# Patient Record
Sex: Female | Born: 1985
Health system: Southern US, Community
[De-identification: ages and names within clinical notes are randomized; demographics above are authoritative.]

## PROBLEM LIST (undated history)

## (undated) DIAGNOSIS — F32A Depression, unspecified: Secondary | ICD-10-CM

## (undated) DIAGNOSIS — J302 Other seasonal allergic rhinitis: Secondary | ICD-10-CM

## (undated) DIAGNOSIS — J45909 Unspecified asthma, uncomplicated: Secondary | ICD-10-CM

## (undated) DIAGNOSIS — B019 Varicella without complication: Secondary | ICD-10-CM

## (undated) DIAGNOSIS — F329 Major depressive disorder, single episode, unspecified: Secondary | ICD-10-CM

## (undated) DIAGNOSIS — K219 Gastro-esophageal reflux disease without esophagitis: Secondary | ICD-10-CM

## (undated) DIAGNOSIS — J301 Allergic rhinitis due to pollen: Secondary | ICD-10-CM

## (undated) HISTORY — DX: Allergic rhinitis due to pollen: J30.1

## (undated) HISTORY — DX: Gastro-esophageal reflux disease without esophagitis: K21.9

## (undated) HISTORY — DX: Major depressive disorder, single episode, unspecified: F32.9

## (undated) HISTORY — DX: Depression, unspecified: F32.A

## (undated) HISTORY — DX: Varicella without complication: B01.9

## (undated) HISTORY — PX: EAR CYST EXCISION: SHX22

---

## 2004-03-18 ENCOUNTER — Ambulatory Visit (HOSPITAL_COMMUNITY): Admission: RE | Admit: 2004-03-18 | Discharge: 2004-03-18 | Payer: Self-pay | Admitting: Internal Medicine

## 2004-11-30 ENCOUNTER — Other Ambulatory Visit: Admission: RE | Admit: 2004-11-30 | Discharge: 2004-11-30 | Payer: Self-pay | Admitting: Gynecology

## 2005-11-28 ENCOUNTER — Other Ambulatory Visit: Admission: RE | Admit: 2005-11-28 | Discharge: 2005-11-28 | Payer: Self-pay | Admitting: Gynecology

## 2016-04-19 ENCOUNTER — Ambulatory Visit
Admission: EM | Admit: 2016-04-19 | Discharge: 2016-04-19 | Disposition: A | Discharge status: Home / Self Care | Payer: PRIVATE HEALTH INSURANCE | Attending: Family Medicine | Admitting: Family Medicine

## 2016-04-19 ENCOUNTER — Ambulatory Visit (INDEPENDENT_AMBULATORY_CARE_PROVIDER_SITE_OTHER): Payer: PRIVATE HEALTH INSURANCE

## 2016-04-19 DIAGNOSIS — R0600 Dyspnea, unspecified: Secondary | ICD-10-CM | POA: Diagnosis present

## 2016-04-19 DIAGNOSIS — S20219D Contusion of unspecified front wall of thorax, subsequent encounter: Secondary | ICD-10-CM | POA: Diagnosis not present

## 2016-04-19 DIAGNOSIS — J45909 Unspecified asthma, uncomplicated: Secondary | ICD-10-CM | POA: Diagnosis not present

## 2016-04-19 DIAGNOSIS — S29011A Strain of muscle and tendon of front wall of thorax, initial encounter: Secondary | ICD-10-CM

## 2016-04-19 DIAGNOSIS — R0789 Other chest pain: Secondary | ICD-10-CM

## 2016-04-19 DIAGNOSIS — S20219A Contusion of unspecified front wall of thorax, initial encounter: Secondary | ICD-10-CM | POA: Insufficient documentation

## 2016-04-19 HISTORY — DX: Other seasonal allergic rhinitis: J30.2

## 2016-04-19 HISTORY — DX: Unspecified asthma, uncomplicated: J45.909

## 2016-04-19 MED ORDER — CYCLOBENZAPRINE HCL 5 MG PO TABS: 5.0000 mg | ORAL_TABLET | Freq: Every day | ORAL | 0 refills | Status: AC

## 2016-04-19 MED ORDER — KETOROLAC TROMETHAMINE 10 MG PO TABS: 10.0000 mg | ORAL_TABLET | Freq: Four times a day (QID) | ORAL | 0 refills | Status: DC | PRN
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
## 2018-03-28 ENCOUNTER — Encounter: Payer: Self-pay | Admitting: Family Medicine

## 2018-03-28 ENCOUNTER — Ambulatory Visit (INDEPENDENT_AMBULATORY_CARE_PROVIDER_SITE_OTHER): Payer: Self-pay | Admitting: Family Medicine

## 2018-03-28 VITALS — BP 134/86 | HR 79 | Temp 97.9°F | Ht 61.0 in | Wt 235.2 lb

## 2018-03-28 DIAGNOSIS — J309 Allergic rhinitis, unspecified: Secondary | ICD-10-CM | POA: Insufficient documentation

## 2018-03-28 DIAGNOSIS — J4530 Mild persistent asthma, uncomplicated: Secondary | ICD-10-CM | POA: Insufficient documentation

## 2018-03-28 MED ORDER — BUDESONIDE-FORMOTEROL FUMARATE 160-4.5 MCG/ACT IN AERO: 2.0000 | INHALATION_SPRAY | Freq: Two times a day (BID) | RESPIRATORY_TRACT | 5 refills | Status: DC

## 2018-03-28 NOTE — Progress Notes (Signed)
Subjective:    Patient ID: Meredith Campbell, female    DOB: Nov 18, 1985, 32 y.o.   MRN: [id number]  HPI   Patient presents to clinic to establish primary care with new PCP.  She recently moved to the [facility] area from [place] for a new job.  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  Patient has no complaints today, but does need a refill on her Symbicort that she takes for control of her asthma and also needs a referral to local allergist due to getting weekly allergy injections for her chronic environmental and food allergies.  Patient Active Problem List   Diagnosis Date Noted  . Chronic allergic rhinitis 03/28/2018  . Mild persistent asthma 03/28/2018   Past Medical History:  Diagnosis Date  . Asthma   . Chicken pox   . Depression   . GERD (gastroesophageal reflux disease)   . Hay fever   . Seasonal allergies    Past Surgical History:  Procedure Laterality Date  . EAR CYST EXCISION     Family History  Problem Relation Age of Onset  . Diabetes Mother   . Hypertension Mother   . Hyperlipidemia Mother   . Kidney disease Paternal Uncle   . Diabetes Maternal Grandmother   . Hypertension Maternal Grandmother   . Stroke Paternal Grandmother    Social History   Tobacco Use  . Smoking status: Never Smoker  . Smokeless tobacco: Never Used  Substance Use Topics  . Alcohol use: No   Review of Systems   Constitutional: Negative for chills, fatigue and fever.  HENT: Negative for congestion, ear pain, sinus pain and sore throat.   Eyes: Negative.   Respiratory: Negative for cough, shortness of breath and wheezing.   Cardiovascular: Negative for chest pain, palpitations and leg swelling.  Gastrointestinal: Negative for abdominal pain, diarrhea, nausea and vomiting.  Genitourinary: Negative for dysuria, frequency and urgency.  Musculoskeletal: Negative for arthralgias and myalgias.  Skin: Negative for  color change, pallor and rash.  Neurological: Negative for syncope, light-headedness and headaches.  Psychiatric/Behavioral: The patient is not nervous/anxious.       Objective:   Physical Exam  Constitutional: She appears well-developed and well-nourished. No distress.  Head: Normocephalic and atraumatic.  Eyes: Pupils are equal, round, and reactive to light. EOM are normal. No scleral icterus.  Neck: Normal range of motion. Neck supple. No tracheal deviation present.  Cardiovascular: Normal rate, regular rhythm and normal heart sounds.  Pulmonary/Chest: Effort normal and breath sounds normal. No respiratory distress. She has no wheezes. She has no rales.   Neurological: She is alert and oriented to person, place, and time.  Gait normal  Skin: Skin is warm and dry. No pallor.  Psychiatric: She has a normal mood and affect. Her behavior is normal. Thought content normal.   Nursing note and vitals reviewed.     Vitals:   03/28/18 0827 03/28/18 0850  BP: (!) 138/98 134/86  Pulse: 79   Temp: 97.9 F (36.6 C)   SpO2: 99%     Assessment & Plan:    Asthma- patient Symbicort refilled.  Patient has albuterol inhaler she uses as needed, currently does not really refill.  Chronic allergic rhinitis-patient takes Zyrtec every day.  Patient given new referral to local allergist so she can continue with her allergy shots.  Patient advised to follow-up in 6 months for management of chronic conditions.  Patient is aware she can return  to clinic at any time if issues arise.

## 2018-05-03 DIAGNOSIS — T781XXA Other adverse food reactions, not elsewhere classified, initial encounter: Secondary | ICD-10-CM | POA: Diagnosis not present

## 2018-05-03 DIAGNOSIS — J4530 Mild persistent asthma, uncomplicated: Secondary | ICD-10-CM | POA: Diagnosis not present

## 2018-05-03 DIAGNOSIS — J309 Allergic rhinitis, unspecified: Secondary | ICD-10-CM | POA: Diagnosis not present

## 2018-05-03 DIAGNOSIS — H1045 Other chronic allergic conjunctivitis: Secondary | ICD-10-CM | POA: Diagnosis not present

## 2018-05-06 MED FILL — MONTELUKAST SOD 10 MG TAB: 10 | 90 days supply | Qty: 90 | Fill #0

## 2018-05-06 MED FILL — EPINEPHRINE 0.3 MG AUTO-INJ: 0.3 | 30 days supply | Qty: 2 | Fill #0

## 2018-05-08 DIAGNOSIS — J301 Allergic rhinitis due to pollen: Secondary | ICD-10-CM | POA: Diagnosis not present

## 2018-05-08 DIAGNOSIS — J3089 Other allergic rhinitis: Secondary | ICD-10-CM | POA: Diagnosis not present

## 2018-05-08 DIAGNOSIS — J3081 Allergic rhinitis due to animal (cat) (dog) hair and dander: Secondary | ICD-10-CM | POA: Diagnosis not present

## 2018-05-13 MED FILL — SYMBICORT 160-4.5 MCG INH: 160-4.5 | 30 days supply | Qty: 10 | Fill #0

## 2018-05-14 DIAGNOSIS — J3089 Other allergic rhinitis: Secondary | ICD-10-CM | POA: Diagnosis not present

## 2018-05-14 DIAGNOSIS — J301 Allergic rhinitis due to pollen: Secondary | ICD-10-CM | POA: Diagnosis not present

## 2018-05-14 DIAGNOSIS — J3081 Allergic rhinitis due to animal (cat) (dog) hair and dander: Secondary | ICD-10-CM | POA: Diagnosis not present

## 2018-05-21 DIAGNOSIS — J3081 Allergic rhinitis due to animal (cat) (dog) hair and dander: Secondary | ICD-10-CM | POA: Diagnosis not present

## 2018-05-21 DIAGNOSIS — J301 Allergic rhinitis due to pollen: Secondary | ICD-10-CM | POA: Diagnosis not present

## 2018-05-21 DIAGNOSIS — J3089 Other allergic rhinitis: Secondary | ICD-10-CM | POA: Diagnosis not present

## 2018-06-05 DIAGNOSIS — J3089 Other allergic rhinitis: Secondary | ICD-10-CM | POA: Diagnosis not present

## 2018-06-05 DIAGNOSIS — J3081 Allergic rhinitis due to animal (cat) (dog) hair and dander: Secondary | ICD-10-CM | POA: Diagnosis not present

## 2018-06-05 DIAGNOSIS — J301 Allergic rhinitis due to pollen: Secondary | ICD-10-CM | POA: Diagnosis not present

## 2018-07-02 ENCOUNTER — Other Ambulatory Visit: Payer: Self-pay

## 2018-07-02 ENCOUNTER — Ambulatory Visit
Admission: EM | Admit: 2018-07-02 | Discharge: 2018-07-02 | Disposition: A | Discharge status: Home / Self Care | Payer: 59 | Attending: Family Medicine | Admitting: Family Medicine

## 2018-07-02 ENCOUNTER — Encounter: Payer: Self-pay | Admitting: Emergency Medicine

## 2018-07-02 DIAGNOSIS — R05 Cough: Secondary | ICD-10-CM | POA: Diagnosis not present

## 2018-07-02 DIAGNOSIS — J111 Influenza due to unidentified influenza virus with other respiratory manifestations: Secondary | ICD-10-CM

## 2018-07-02 DIAGNOSIS — R69 Illness, unspecified: Secondary | ICD-10-CM | POA: Insufficient documentation

## 2018-07-02 DIAGNOSIS — J029 Acute pharyngitis, unspecified: Secondary | ICD-10-CM

## 2018-07-02 DIAGNOSIS — J3489 Other specified disorders of nose and nasal sinuses: Secondary | ICD-10-CM

## 2018-07-02 LAB — RAPID INFLUENZA A&B ANTIGENS (ARMC ONLY)
INFLUENZA A (ARMC): NEGATIVE
INFLUENZA B (ARMC): NEGATIVE

## 2018-07-02 LAB — RAPID STREP SCREEN (MED CTR MEBANE ONLY): STREPTOCOCCUS, GROUP A SCREEN (DIRECT): NEGATIVE

## 2018-07-02 MED ORDER — OSELTAMIVIR PHOSPHATE 75 MG PO CAPS: 75.0000 mg | ORAL_CAPSULE | Freq: Two times a day (BID) | ORAL | 0 refills | Status: DC

## 2018-07-02 NOTE — ED Provider Notes (Signed)
[facility]    CSN: [id number] Arrival date & time: 07/02/18  1802     History   Chief Complaint Chief Complaint  Patient presents with  . Generalized Body Aches    HPI Meredith Campbell is a 32 y.o. female.   The history is provided by the patient.  URI  Presenting symptoms: congestion, cough, fatigue, fever, rhinorrhea and sore throat   Severity:  Moderate Onset quality:  Sudden Duration:  1 day Timing:  Constant Progression:  Unchanged Chronicity:  New Relieved by:  None tried Ineffective treatments:  None tried Associated symptoms: myalgias   Associated symptoms: no sinus pain and no wheezing   Risk factors: chronic respiratory disease (asthma) and sick contacts   Risk factors: not elderly, no chronic cardiac disease, no chronic kidney disease, no diabetes mellitus, no immunosuppression, no recent illness and no recent travel     Past Medical History:  Diagnosis Date  . Asthma   . Chicken pox   . Depression   . GERD (gastroesophageal reflux disease)   . Hay fever   . Seasonal allergies     Patient Active Problem List   Diagnosis Date Noted  . Chronic allergic rhinitis 03/28/2018  . Mild persistent asthma 03/28/2018    Past Surgical History:  Procedure Laterality Date  . EAR CYST EXCISION      OB History   No obstetric history on file.      Home Medications    Prior to Admission medications   Medication Sig Start Date End Date Taking? Authorizing Provider  albuterol (PROVENTIL HFA;VENTOLIN HFA) 108 (90 Base) MCG/ACT inhaler Inhale into the lungs every 6 (six) hours as needed for wheezing or shortness of breath.   Yes [provider]  budesonide-formoterol (SYMBICORT) 160-4.5 MCG/ACT inhaler Inhale 2 puffs into the lungs 2 (two) times daily. 03/28/18  Yes Guse, Janna ArchLauren M, FNP  cetirizine (ZYRTEC) 10 MG tablet Take 10 mg by mouth daily.   Yes [provider]  fexofenadine (ALLEGRA) 60 MG tablet Take 60 mg by mouth 2  (two) times daily.   Yes [provider]  ergocalciferol (VITAMIN D2) 50000 units capsule Take 50,000 Units by mouth once a week.    [provider]  oseltamivir (TAMIFLU) 75 MG capsule Take 1 capsule (75 mg total) by mouth 2 (two) times daily. 07/02/18   Payton Mccallumonty, Earlena Werst, MD    Family History Family History  Problem Relation Age of Onset  . Diabetes Mother   . Hypertension Mother   . Hyperlipidemia Mother   . Kidney disease Paternal Uncle   . Diabetes Maternal Grandmother   . Hypertension Maternal Grandmother   . Stroke Paternal Grandmother     Social History Social History   Tobacco Use  . Smoking status: Never Smoker  . Smokeless tobacco: Never Used  Substance Use Topics  . Alcohol use: No  . Drug use: No     Allergies   Almond (diagnostic); Apple; Corn-containing products; Pear; Pearson sakrin [saccharin ammonium]; Plum pulp; Pork-derived products; Rice; and Wheat bran   Review of Systems Review of Systems  Constitutional: Positive for fatigue and fever.  HENT: Positive for congestion, rhinorrhea and sore throat. Negative for sinus pain.   Respiratory: Positive for cough. Negative for wheezing.   Musculoskeletal: Positive for myalgias.     Physical Exam Triage Vital Signs ED Triage Vitals  Enc Vitals Group     BP 07/02/18 1844 129/78     Pulse Rate 07/02/18 1844 (!) 128  Resp 07/02/18 1844 20     Temp 07/02/18 1844 (!) 100.7 F (38.2 C)     Temp Source 07/02/18 1844 Oral     SpO2 07/02/18 1844 100 %     Weight 07/02/18 1840 235 lb (106.6 kg)     Height 07/02/18 1840 5\' 1"  (1.549 m)     Head Circumference --      Peak Flow --      Pain Score 07/02/18 1839 8     Pain Loc --      Pain Edu? --      Excl. in GC? --    No data found.  Updated Vital Signs BP 129/78 (BP Location: Left Arm)   Pulse (!) 128   Temp (!) 100.7 F (38.2 C) (Oral)   Resp 20   Ht 5\' 1"  (1.549 m)   Wt 106.6 kg   LMP 06/01/2018   SpO2 100%   BMI 44.40  kg/m   Visual Acuity Right Eye Distance:   Left Eye Distance:   Bilateral Distance:    Right Eye Near:   Left Eye Near:    Bilateral Near:     Physical Exam Vitals signs and nursing note reviewed.  Constitutional:      General: She is not in acute distress.    Appearance: She is well-developed. She is not toxic-appearing or diaphoretic.  HENT:     Head: Normocephalic and atraumatic.     Right Ear: Tympanic membrane, ear canal and external ear normal.     Left Ear: Tympanic membrane, ear canal and external ear normal.     Nose: No nasal deformity, septal deviation or laceration.     Mouth/Throat:     Pharynx: Uvula midline. No pharyngeal swelling, oropharyngeal exudate, posterior oropharyngeal erythema or uvula swelling.  Eyes:     General: No scleral icterus.       Right eye: No discharge.        Left eye: No discharge.  Neck:     Musculoskeletal: Normal range of motion and neck supple.     Thyroid: No thyromegaly.  Cardiovascular:     Rate and Rhythm: Normal rate and regular rhythm.     Heart sounds: Normal heart sounds.  Pulmonary:     Effort: Pulmonary effort is normal. No respiratory distress.     Breath sounds: Normal breath sounds. No stridor. No wheezing, rhonchi or rales.  Lymphadenopathy:     Cervical: No cervical adenopathy.  Neurological:     Mental Status: She is alert.      UC Treatments / Results  Labs (all labs ordered are listed, but only abnormal results are displayed) Labs Reviewed  RAPID INFLUENZA A&B ANTIGENS ([facility] ONLY)  RAPID STREP SCREEN ([facility] ONLY)  CULTURE, GROUP A STREP [facility])    EKG None  Radiology No results found.  Procedures Procedures (including critical care time)  Medications Ordered in UC Medications - No data to display  Initial Impression / Assessment and Plan / UC Course  I have reviewed the triage vital signs and the nursing notes.  Pertinent labs & imaging results that were available during my care  of the patient were reviewed by me and considered in my medical decision making (see chart for details).      Final Clinical Impressions(s) / UC Diagnoses   Final diagnoses:  Influenza-like illness    ED Prescriptions    Medication Sig Dispense Auth. Provider   oseltamivir (TAMIFLU) 75 MG capsule Take  1 capsule (75 mg total) by mouth 2 (two) times daily. 10 capsule Payton Mccallum, MD     1. Lab results and diagnosis reviewed with patient 2. rx as per orders above; reviewed possible side effects, interactions, risks and benefits  3. Recommend supportive treatment with rest, fluids 4. Follow-up prn if symptoms worsen or don't improve  Controlled Substance Prescriptions [place] Controlled Substance Registry consulted? Not Applicable   Payton Mccallum, MD 07/02/18 2013

## 2018-07-02 NOTE — ED Triage Notes (Signed)
Pt c/o headache, nausea, bilateral ear pain, body aches, sore throat.and shortness of breath. Started yesterday.

## 2018-07-05 ENCOUNTER — Encounter: Payer: Self-pay | Admitting: Emergency Medicine

## 2018-07-05 ENCOUNTER — Ambulatory Visit
Admission: EM | Admit: 2018-07-05 | Discharge: 2018-07-05 | Disposition: A | Discharge status: Home / Self Care | Payer: 59 | Attending: Physician Assistant | Admitting: Physician Assistant

## 2018-07-05 ENCOUNTER — Other Ambulatory Visit: Payer: Self-pay

## 2018-07-05 DIAGNOSIS — J029 Acute pharyngitis, unspecified: Secondary | ICD-10-CM | POA: Insufficient documentation

## 2018-07-05 DIAGNOSIS — H6643 Suppurative otitis media, unspecified, bilateral: Secondary | ICD-10-CM | POA: Diagnosis not present

## 2018-07-05 LAB — CULTURE, GROUP A STREP (THRC)

## 2018-07-05 LAB — RAPID STREP SCREEN (MED CTR MEBANE ONLY): Streptococcus, Group A Screen (Direct): NEGATIVE

## 2018-07-05 MED ORDER — FLUTICASONE PROPIONATE 50 MCG/ACT NA SUSP: 1.0000 | Freq: Every day | NASAL | 2 refills | Status: DC

## 2018-07-05 MED ORDER — AMOXICILLIN-POT CLAVULANATE 875-125 MG PO TABS: 1.0000 | ORAL_TABLET | Freq: Two times a day (BID) | ORAL | 0 refills | Status: DC

## 2018-07-05 NOTE — ED Triage Notes (Signed)
Patient in today c/o sore throat, laryngitis and bilateral ear pain L>R x 3 days. Patient was seen 07/02/18 for same, but is getting worse. Patient denies fever, but patient has been taking Tylenol and Ibuprofen routinely.

## 2018-07-05 NOTE — ED Provider Notes (Signed)
[facility]    CSN: [id number] Arrival date & time: 07/05/18  4098     History   Chief Complaint Chief Complaint  Patient presents with  . Sore Throat  . Laryngitis    HPI Meredith Campbell is a 33 y.o. female.   Patient is a 33 year old female who presents with complaint of sore throat and nasal congestion since this past Tuesday.  Today is Friday and states her symptoms are getting worse.  Patient was seen at this facility on December 31 for flulike symptoms and given a prescription for Tamiflu.  Patient states her muscle aches nausea have improved and denies any current chills.  Patient does report nasal congestion and headache as well as some bilateral ear pain.  Patient she has been taking ibuprofen and Tylenol around-the-clock as well as Cepacol lozenges for cough.  Patient does states she is taking Symbicort twice a day as normal for her asthma but has had to use her albuterol inhaler 1-2 times a day which is not normal for her.  Patient did have a fever when she was here on Tuesday of 100.7 but has not had any since then but again is taking the NSAIDs.     Past Medical History:  Diagnosis Date  . Asthma   . Chicken pox   . Depression   . GERD (gastroesophageal reflux disease)   . Hay fever   . Seasonal allergies     Patient Active Problem List   Diagnosis Date Noted  . Chronic allergic rhinitis 03/28/2018  . Mild persistent asthma 03/28/2018    Past Surgical History:  Procedure Laterality Date  . EAR CYST EXCISION      OB History   No obstetric history on file.      Home Medications    Prior to Admission medications   Medication Sig Start Date End Date Taking? Authorizing Provider  albuterol (PROVENTIL HFA;VENTOLIN HFA) 108 (90 Base) MCG/ACT inhaler Inhale into the lungs every 6 (six) hours as needed for wheezing or shortness of breath.   Yes [provider]  budesonide-formoterol (SYMBICORT) 160-4.5 MCG/ACT inhaler Inhale 2 puffs  into the lungs 2 (two) times daily. 03/28/18  Yes Guse, Janna Arch, FNP  cetirizine (ZYRTEC) 10 MG tablet Take 10 mg by mouth daily.   Yes [provider]  ergocalciferol (VITAMIN D2) 50000 units capsule Take 50,000 Units by mouth once a week.   Yes [provider]  fexofenadine (ALLEGRA) 60 MG tablet Take 60 mg by mouth 2 (two) times daily.   Yes [provider]  oseltamivir (TAMIFLU) 75 MG capsule Take 1 capsule (75 mg total) by mouth 2 (two) times daily. 07/02/18  Yes Payton Mccallum, MD  amoxicillin-clavulanate (AUGMENTIN) 875-125 MG tablet Take 1 tablet by mouth every 12 (twelve) hours. 07/05/18   Candis Schatz, PA-C  fluticasone (FLONASE) 50 MCG/ACT nasal spray Place 1 spray into both nostrils daily. 07/05/18   Candis Schatz, PA-C    Family History Family History  Problem Relation Age of Onset  . Diabetes Mother   . Hypertension Mother   . Hyperlipidemia Mother   . Kidney disease Paternal Uncle   . Diabetes Maternal Grandmother   . Hypertension Maternal Grandmother   . Stroke Paternal Grandmother     Social History Social History   Tobacco Use  . Smoking status: Never Smoker  . Smokeless tobacco: Never Used  Substance Use Topics  . Alcohol use: No  . Drug use: No  Allergies   Almond (diagnostic); Apple; Corn-containing products; Pear; Pearson sakrin [saccharin ammonium]; Plum pulp; Pork-derived products; Rice; and Wheat bran   Review of Systems Review of Systems as noted above in HPI.  Other systems reviewed and found to be negative.   Physical Exam Triage Vital Signs ED Triage Vitals  Enc Vitals Group     BP 07/05/18 0846 126/88     Pulse Rate 07/05/18 0846 99     Resp 07/05/18 0846 16     Temp 07/05/18 0846 98 F (36.7 C)     Temp Source 07/05/18 0846 Oral     SpO2 07/05/18 0846 99 %     Weight 07/05/18 0846 235 lb (106.6 kg)     Height 07/05/18 0846 5\' 1"  (1.549 m)     Head Circumference --      Peak Flow --      Pain  Score 07/05/18 0845 9     Pain Loc --      Pain Edu? --      Excl. in GC? --    No data found.  Updated Vital Signs BP 126/88 (BP Location: Left Arm)   Pulse 99   Temp 98 F (36.7 C) (Oral)   Resp 16   Ht 5\' 1"  (1.549 m)   Wt 235 lb (106.6 kg)   LMP 07/02/2018 (Exact Date)   SpO2 99%   BMI 44.40 kg/m    Physical Exam Vitals signs reviewed.  Constitutional:      Appearance: She is well-developed. She is not ill-appearing.  HENT:     Right Ear: A middle ear effusion is present.     Left Ear: A middle ear effusion is present.     Mouth/Throat:     Mouth: Mucous membranes are moist.     Pharynx: Posterior oropharyngeal erythema present.     Tonsils: Tonsillar exudate present. Swelling: 1+ on the right. 1+ on the left.  Neck:     Musculoskeletal: Normal range of motion.  Cardiovascular:     Rate and Rhythm: Normal rate and regular rhythm.     Heart sounds: Normal heart sounds. No murmur. No gallop.   Pulmonary:     Effort: Pulmonary effort is normal.     Breath sounds: Normal breath sounds. No wheezing, rhonchi or rales.  Abdominal:     Palpations: Abdomen is soft.  Lymphadenopathy:     Cervical: No cervical adenopathy.  Skin:    General: Skin is warm and dry.     Capillary Refill: Capillary refill takes less than 2 seconds.  Neurological:     General: No focal deficit present.     Mental Status: She is alert and oriented to person, place, and time.  Psychiatric:        Mood and Affect: Mood normal.        Behavior: Behavior normal.      UC Treatments / Results  Labs (all labs ordered are listed, but only abnormal results are displayed) Labs Reviewed  RAPID STREP SCREEN ([facility] ONLY)  CULTURE, GROUP A STREP [facility])    EKG None  Radiology No results found.  Procedures Procedures (including critical care time)  Medications Ordered in UC Medications - No data to display  Initial Impression / Assessment and Plan / UC Course  I have reviewed  the triage vital signs and the nursing notes.  Pertinent labs & imaging results that were available during my care of the patient were reviewed by me  and considered in my medical decision making (see chart for details).     Patient with sore throat since Tuesday but getting worse.  Patient was seen on 31st with flulike symptoms and started on Tamiflu.  She states those symptoms were improving.  Rapid strep was negative.  However her tonsils were swollen with exudate noted.  Patient also had fluid noted to both ears.  Patient given prescription for Augmentin as well as fluticasone to help with drainage.  Patient advised to try I Profen Tylenol as needed for pain and to push fluids.  Patient verbalized understanding.  Final Clinical Impressions(s) / UC Diagnoses   Final diagnoses:  Suppurative otitis media of both ears, unspecified chronicity  Sore throat     Discharge Instructions     -Augmentin: one tablet twice a day for 7 days -Flonase: both nostrils with spray directed towards ears -continue Tylenol and ibuprofen for pain -fluids -follow up with PCP as needed    ED Prescriptions    Medication Sig Dispense Auth. Provider   amoxicillin-clavulanate (AUGMENTIN) 875-125 MG tablet Take 1 tablet by mouth every 12 (twelve) hours. 14 tablet Candis SchatzHarris, Letticia Bhattacharyya D, PA-C   fluticasone [facility](FLONASE) 50 MCG/ACT nasal spray Place 1 spray into both nostrils daily. 16 g Candis SchatzHarris, Sharmila Wrobleski D, PA-C     Controlled Substance Prescriptions  Controlled Substance Registry consulted? Not Applicable   Candis SchatzHarris, Starlyn Droge D, PA-C 07/05/18 1625

## 2018-07-05 NOTE — Discharge Instructions (Signed)
-  Augmentin: one tablet twice a day for 7 days -Flonase: both nostrils with spray directed towards ears -continue Tylenol and ibuprofen for pain -fluids -follow up with PCP as needed

## 2018-07-07 LAB — CULTURE, GROUP A STREP (THRC)

## 2018-07-17 ENCOUNTER — Ambulatory Visit: Payer: Self-pay

## 2018-07-17 NOTE — Telephone Encounter (Signed)
Patient called in with c/o "cough, chest tightness." She says "I started coughing last Friday and have been using my inhalers, OTC cough medicine. Now I'm coughing a yellowish-green tint sputum, having shortness of breath. I am up all night coughing, coughing all day." I asked about other symptoms, she denies fever, but says wheezing and chest tightness. I asked her to elaborate on the chest tightness, she says "it's a burning discomfort that I feel and it's the same feeling I have when my asthma flares up when I get sick." During triage call, patient coughed once, did not appear to be struggling to breathe, no wheezing heard. She says she doesn't want to miss any more days at work, she's available after 2 pm. According to protocol, see PCP within 24 hours, appointment scheduled for tomorrow at 1540 with Leanora Cover, FNP, care advice given, patient verbalized understanding.   Reason for Disposition . [1] Continuous (nonstop) coughing interferes with work or school AND [2] no improvement using cough treatment per Care Advice  Answer Assessment - Initial Assessment Questions 1. ONSET: "When did the cough begin?"      Last Friday 2. SEVERITY: "How bad is the cough today?"      Coughing all day, up all night 3. RESPIRATORY DISTRESS: "Describe your breathing."      Shortness of breath 4. FEVER: "Do you have a fever?" If so, ask: "What is your temperature, how was it measured, and when did it start?"     No 5. SPUTUM: "Describe the color of your sputum" (clear, white, yellow, green)     Yellowish/green tint 6. HEMOPTYSIS: "Are you coughing up any blood?" If so ask: "How much?" (flecks, streaks, tablespoons, etc.)     No 7. CARDIAC HISTORY: "Do you have any history of heart disease?" (e.g., heart attack, congestive heart failure)      No 8. LUNG HISTORY: "Do you have any history of lung disease?"  (e.g., pulmonary embolus, asthma, emphysema)     Asthma 9. PE RISK FACTORS: "Do you have a history of  blood clots?" (or: recent major surgery, recent prolonged travel, bedridden)     No 10. OTHER SYMPTOMS: "Do you have any other symptoms?" (e.g., runny nose, wheezing, chest pain)       Wheezing, chest tightness 11. PREGNANCY: "Is there any chance you are pregnant?" "When was your last menstrual period?"       No; LMP 07/08/18 12. TRAVEL: "Have you traveled out of the country in the last month?" (e.g., travel history, exposures)       No  Protocols used: COUGH - ACUTE PRODUCTIVE-A-AH

## 2018-07-18 ENCOUNTER — Encounter: Payer: Self-pay | Admitting: Family Medicine

## 2018-07-18 ENCOUNTER — Ambulatory Visit (INDEPENDENT_AMBULATORY_CARE_PROVIDER_SITE_OTHER): Payer: 59 | Admitting: Family Medicine

## 2018-07-18 ENCOUNTER — Ambulatory Visit (INDEPENDENT_AMBULATORY_CARE_PROVIDER_SITE_OTHER): Payer: 59

## 2018-07-18 VITALS — BP 122/88 | HR 95 | Temp 98.3°F | Resp 18 | Ht 61.0 in | Wt 229.4 lb

## 2018-07-18 DIAGNOSIS — J4530 Mild persistent asthma, uncomplicated: Secondary | ICD-10-CM | POA: Diagnosis not present

## 2018-07-18 DIAGNOSIS — R05 Cough: Secondary | ICD-10-CM

## 2018-07-18 DIAGNOSIS — R058 Other specified cough: Secondary | ICD-10-CM

## 2018-07-18 DIAGNOSIS — J4531 Mild persistent asthma with (acute) exacerbation: Secondary | ICD-10-CM | POA: Diagnosis not present

## 2018-07-18 MED ORDER — METHYLPREDNISOLONE ACETATE 40 MG/ML IJ SUSP
40.0000 mg | Freq: Once | INTRAMUSCULAR | Status: AC
  Administered 2018-07-18: 40 mg via INTRAMUSCULAR

## 2018-07-18 MED ORDER — PREDNISONE 10 MG (21) PO TBPK: ORAL_TABLET | ORAL | 0 refills | Status: DC

## 2018-07-18 MED ORDER — DOXYCYCLINE HYCLATE 100 MG PO TABS: 100.0000 mg | ORAL_TABLET | Freq: Two times a day (BID) | ORAL | 0 refills | Status: DC

## 2018-07-18 NOTE — Progress Notes (Signed)
Subjective:    Patient ID: Meredith Campbell, female    DOB: 1985-12-29, 33 y.o.   MRN: [id number]  HPI   Patient presents to clinic complaining of cough productive of thick green phlegm, chest congestion, feelings of shortness of breath and wheezing.  Patient is concerned that she might be developing a pneumonia due to her asthma history.  Patient states last winter she had a difficult season, and ended up in the hospital for short time due to respiratory infection.  Currently denies any fever or chills.  Denies any chest pain.  Denies nausea/vomiting or diarrhea.  Patient has not been using her nebulizer machine 1-2 times per day over the past week, and also has her albuterol inhaler that she brings along with her and she is out of the house.  She also takes Symbicort twice daily Patient Active Problem List   Diagnosis Date Noted  . Chronic allergic rhinitis 03/28/2018  . Mild persistent asthma 03/28/2018   Social History   Tobacco Use  . Smoking status: Never Smoker  . Smokeless tobacco: Never Used  Substance Use Topics  . Alcohol use: No   Review of Systems   Constitutional: Negative for chills, fatigue and fever.  HENT: Negative for congestion, ear pain, sinus pain and sore throat.   Eyes: Negative.   Respiratory: +cough with green phlegm, chest congestion, shortness of breath and wheezing.   Cardiovascular: Negative for chest pain, palpitations and leg swelling.  Gastrointestinal: Negative for abdominal pain, diarrhea, nausea and vomiting.  Genitourinary: Negative for dysuria, frequency and urgency.  Musculoskeletal: Negative for arthralgias and myalgias.  Skin: Negative for color change, pallor and rash.  Neurological: Negative for syncope, light-headedness and headaches.  Psychiatric/Behavioral: The patient is not nervous/anxious.       Objective:   Physical Exam Vitals signs and nursing note reviewed.  Constitutional:      General: She is not in acute  distress.    Appearance: Normal appearance. She is not toxic-appearing or diaphoretic.  HENT:     Head: Normocephalic and atraumatic.     Nose: Nose normal.     Mouth/Throat:     Mouth: Mucous membranes are moist.     Pharynx: No oropharyngeal exudate or posterior oropharyngeal erythema.  Eyes:     General: No scleral icterus.    Extraocular Movements: Extraocular movements intact.     Conjunctiva/sclera: Conjunctivae normal.  Neck:     Musculoskeletal: Neck supple. No neck rigidity.  Cardiovascular:     Rate and Rhythm: Normal rate and regular rhythm.  Pulmonary:     Effort: Pulmonary effort is normal. No respiratory distress.     Breath sounds: Wheezing (scattered faint expiratory wheezes) present. No rhonchi or rales.  Musculoskeletal:     Right lower leg: No edema.     Left lower leg: No edema.  Lymphadenopathy:     Cervical: No cervical adenopathy.  Skin:    General: Skin is warm and dry.     Coloration: Skin is not pale.  Neurological:     Mental Status: She is alert and oriented to person, place, and time.    Vitals:   07/18/18 1556  BP: 122/88  Pulse: 95  Resp: 18  Temp: 98.3 F (36.8 C)  SpO2: 99%       Assessment & Plan:   Cough productive of purulent sputum, mild persistent asthma with acute exacerbation-we will get chest x-ray in clinic today.  Patient also will get IM methylprednisolone x1.  She  will take steroid taper and doxycycline course.  Advised she can use over-the-counter Mucinex to help calm cough.  Encouraged to continue using her nebulizer at home as she has been doing in addition to her Symbicort as prescribed. Administrations This Visit    methylPREDNISolone acetate (DEPO-MEDROL) injection 40 mg    Admin Date 07/18/2018 Action Given Dose 40 mg Route Intramuscular Administered By Clearnce Sorrel, RMA           Advised to follow-up in 1 to 2 weeks for recheck on how she is doing and to be sure she is improving. She can return to  clinic sooner if any issues arise or current symptoms persist or worsen

## 2018-07-19 ENCOUNTER — Encounter: Payer: Self-pay | Admitting: Family Medicine

## 2018-07-23 DIAGNOSIS — J301 Allergic rhinitis due to pollen: Secondary | ICD-10-CM | POA: Diagnosis not present

## 2018-07-23 DIAGNOSIS — L503 Dermatographic urticaria: Secondary | ICD-10-CM | POA: Diagnosis not present

## 2018-07-23 DIAGNOSIS — J3089 Other allergic rhinitis: Secondary | ICD-10-CM | POA: Diagnosis not present

## 2018-07-23 DIAGNOSIS — J4540 Moderate persistent asthma, uncomplicated: Secondary | ICD-10-CM | POA: Diagnosis not present

## 2018-08-01 ENCOUNTER — Ambulatory Visit (INDEPENDENT_AMBULATORY_CARE_PROVIDER_SITE_OTHER): Payer: 59 | Admitting: Family Medicine

## 2018-08-01 ENCOUNTER — Encounter: Payer: Self-pay | Admitting: Family Medicine

## 2018-08-01 VITALS — BP 118/76 | HR 88 | Temp 98.1°F | Resp 18 | Ht 61.0 in | Wt 227.0 lb

## 2018-08-01 DIAGNOSIS — J4530 Mild persistent asthma, uncomplicated: Secondary | ICD-10-CM

## 2018-08-01 DIAGNOSIS — R05 Cough: Secondary | ICD-10-CM

## 2018-08-01 DIAGNOSIS — R058 Other specified cough: Secondary | ICD-10-CM

## 2018-08-01 DIAGNOSIS — J309 Allergic rhinitis, unspecified: Secondary | ICD-10-CM | POA: Diagnosis not present

## 2018-08-01 DIAGNOSIS — J4531 Mild persistent asthma with (acute) exacerbation: Secondary | ICD-10-CM

## 2018-08-01 NOTE — Progress Notes (Signed)
   Subjective:    Patient ID: Meredith Campbell, female    DOB: 27-Aug-1985, 33 y.o.   MRN: [id number]  HPI   Patient presents to clinic for follow-up on her asthma after having exacerbation.  Patient was treated with course of doxycycline and steroid taper.  She has been continuing with her regular allergy injections in addition to allergy medications.  Patient states she is happy to say she was feeling much much better.  She is back to exercising 3-4 times per week.  She continues to use her Symbicort twice daily and albuterol if needed, but has not needed her albuterol in the past week.  Denies any fever or chills.  Denies wheezing or shortness of breath.  Denies chest pain.  Denies nausea/vomiting or diarrhea.  Cough has resolved.  Patient Active Problem List   Diagnosis Date Noted  . Chronic allergic rhinitis 03/28/2018  . Mild persistent asthma 03/28/2018   Social History   Tobacco Use  . Smoking status: Never Smoker  . Smokeless tobacco: Never Used  Substance Use Topics  . Alcohol use: No   Review of Systems  Constitutional: Negative for chills, fatigue and fever.  HENT: Negative for congestion, ear pain, sinus pain and sore throat.   Eyes: Negative.   Respiratory: Negative for cough, shortness of breath and wheezing.   Cardiovascular: Negative for chest pain, palpitations and leg swelling.  Gastrointestinal: Negative for abdominal pain, diarrhea, nausea and vomiting.  Genitourinary: Negative for dysuria, frequency and urgency.  Musculoskeletal: Negative for arthralgias and myalgias.  Skin: Negative for color change, pallor and rash.  Neurological: Negative for syncope, light-headedness and headaches.  Psychiatric/Behavioral: The patient is not nervous/anxious.       Objective:   Physical Exam  Constitutional: She appears well-developed and well-nourished. No distress.  HENT:  Head: Normocephalic and atraumatic.  Eyes: EOM are normal. No scleral icterus. Ears:  Normal Nose/throat: Mild postnasal drainage, but otherwise appear unremarkable. Neck: Normal range of motion. Neck supple. No tracheal deviation present.  Cardiovascular: Normal rate, regular rhythm and normal heart sounds.  Pulmonary/Chest: Effort normal and breath sounds normal. No respiratory distress. She has no wheezes. She has no rales.  Neurological: She is alert and oriented to person, place, and time.  Gait normal  Skin: Skin is warm and dry. No pallor.  Psychiatric: She has a normal mood and affect. Her behavior is normal. Thought content normal.   Nursing note and vitals reviewed.   Vitals:   08/01/18 0836  BP: 118/76  Pulse: 88  Resp: 18  Temp: 98.1 F (36.7 C)  SpO2: 95%      Assessment & Plan:   Mild persistent asthma, cough productive of purulent sputum, asthma exacerbation, chronic allergic rhinitis - cough has resolved.  Asthma exacerbation has resolved also.  She will continue her regular stabilization asthma medications as well as her allergy medications.  Patient will keep regularly scheduled follow-up as already planned.  Advised she can return to clinic sooner if any issues arise.

## 2018-08-21 DIAGNOSIS — J301 Allergic rhinitis due to pollen: Secondary | ICD-10-CM | POA: Diagnosis not present

## 2018-08-21 DIAGNOSIS — J3089 Other allergic rhinitis: Secondary | ICD-10-CM | POA: Diagnosis not present

## 2018-08-21 DIAGNOSIS — J3081 Allergic rhinitis due to animal (cat) (dog) hair and dander: Secondary | ICD-10-CM | POA: Diagnosis not present

## 2018-08-28 DIAGNOSIS — J301 Allergic rhinitis due to pollen: Secondary | ICD-10-CM | POA: Diagnosis not present

## 2018-08-28 DIAGNOSIS — J3089 Other allergic rhinitis: Secondary | ICD-10-CM | POA: Diagnosis not present

## 2018-09-03 MED FILL — EPINEPHRINE 0.3 MG AUTO-INJ: 0.3 | 30 days supply | Qty: 2 | Fill #0

## 2018-09-04 DIAGNOSIS — J3089 Other allergic rhinitis: Secondary | ICD-10-CM | POA: Diagnosis not present

## 2018-09-04 DIAGNOSIS — J301 Allergic rhinitis due to pollen: Secondary | ICD-10-CM | POA: Diagnosis not present

## 2018-09-04 MED FILL — SYMBICORT 160-4.5 MCG INH: 160-4.5 | 30 days supply | Qty: 10 | Fill #1

## 2018-09-11 DIAGNOSIS — J301 Allergic rhinitis due to pollen: Secondary | ICD-10-CM | POA: Diagnosis not present

## 2018-09-11 DIAGNOSIS — J3089 Other allergic rhinitis: Secondary | ICD-10-CM | POA: Diagnosis not present

## 2018-09-14 MED FILL — ALBUTEROL 0.083% INHAL SOLN: (2.5 MG/3ML | 5 days supply | Qty: 75 | Fill #0

## 2018-09-14 MED FILL — FLUoxetine HCL 20 MG CAPS: 20 | 90 days supply | Qty: 90 | Fill #0

## 2018-09-14 MED FILL — PROAIR HFA 90 MCG INHALER: 108 (90 BAS | 25 days supply | Qty: 9 | Fill #0

## 2018-09-26 ENCOUNTER — Ambulatory Visit: Payer: Self-pay | Admitting: Family Medicine

## 2018-10-03 DIAGNOSIS — J3089 Other allergic rhinitis: Secondary | ICD-10-CM | POA: Diagnosis not present

## 2018-10-03 DIAGNOSIS — J301 Allergic rhinitis due to pollen: Secondary | ICD-10-CM | POA: Diagnosis not present

## 2018-10-09 DIAGNOSIS — J3089 Other allergic rhinitis: Secondary | ICD-10-CM | POA: Diagnosis not present

## 2018-10-09 DIAGNOSIS — J301 Allergic rhinitis due to pollen: Secondary | ICD-10-CM | POA: Diagnosis not present

## 2018-10-14 DIAGNOSIS — J3089 Other allergic rhinitis: Secondary | ICD-10-CM | POA: Diagnosis not present

## 2018-10-14 DIAGNOSIS — J301 Allergic rhinitis due to pollen: Secondary | ICD-10-CM | POA: Diagnosis not present

## 2018-10-14 DIAGNOSIS — J3081 Allergic rhinitis due to animal (cat) (dog) hair and dander: Secondary | ICD-10-CM | POA: Diagnosis not present

## 2018-10-16 DIAGNOSIS — J3089 Other allergic rhinitis: Secondary | ICD-10-CM | POA: Diagnosis not present

## 2018-10-16 DIAGNOSIS — J301 Allergic rhinitis due to pollen: Secondary | ICD-10-CM | POA: Diagnosis not present

## 2018-10-23 DIAGNOSIS — J301 Allergic rhinitis due to pollen: Secondary | ICD-10-CM | POA: Diagnosis not present

## 2018-10-23 DIAGNOSIS — J3089 Other allergic rhinitis: Secondary | ICD-10-CM | POA: Diagnosis not present

## 2018-10-30 DIAGNOSIS — J3089 Other allergic rhinitis: Secondary | ICD-10-CM | POA: Diagnosis not present

## 2018-10-30 DIAGNOSIS — J301 Allergic rhinitis due to pollen: Secondary | ICD-10-CM | POA: Diagnosis not present

## 2018-10-31 ENCOUNTER — Ambulatory Visit (INDEPENDENT_AMBULATORY_CARE_PROVIDER_SITE_OTHER): Payer: 59 | Admitting: Family Medicine

## 2018-10-31 ENCOUNTER — Other Ambulatory Visit: Payer: Self-pay

## 2018-10-31 DIAGNOSIS — R030 Elevated blood-pressure reading, without diagnosis of hypertension: Secondary | ICD-10-CM | POA: Diagnosis not present

## 2018-10-31 DIAGNOSIS — R0600 Dyspnea, unspecified: Secondary | ICD-10-CM

## 2018-10-31 DIAGNOSIS — J309 Allergic rhinitis, unspecified: Secondary | ICD-10-CM

## 2018-10-31 DIAGNOSIS — F4329 Adjustment disorder with other symptoms: Secondary | ICD-10-CM

## 2018-10-31 DIAGNOSIS — J4530 Mild persistent asthma, uncomplicated: Secondary | ICD-10-CM

## 2018-10-31 MED ORDER — METOPROLOL SUCCINATE ER 50 MG PO TB24: 50.0000 mg | ORAL_TABLET | Freq: Every day | ORAL | 0 refills | Status: DC

## 2018-10-31 MED ORDER — BUDESONIDE-FORMOTEROL FUMARATE 160-4.5 MCG/ACT IN AERO: 2.0000 | INHALATION_SPRAY | Freq: Two times a day (BID) | RESPIRATORY_TRACT | 5 refills | Status: DC

## 2018-10-31 MED FILL — METOPROLOL SUCCINATE ER 50: 50 | 90 days supply | Qty: 90 | Fill #0

## 2018-10-31 NOTE — Progress Notes (Signed)
Patient ID: Meredith Campbell, female   DOB: 1986/06/25, 33 y.o.   MRN: [id number]  Virtual Visit via video Note  This visit type was conducted due to national recommendations for restrictions regarding the COVID-19 pandemic (e.g. social distancing).  This format is felt to be most appropriate for this patient at this time.  All issues noted in this document were discussed and addressed.  No physical exam was performed (except for noted visual exam findings with Video Visits).   I connected with Meredith Campbell on 10/31/18 at  8:00 AM EDT by a video enabled telemedicine application and verified that I am speaking with the correct person using two identifiers. Location patient: home Location provider: [facility]. Persons participating in the virtual visit: patient, provider  I discussed the limitations, risks, security and privacy concerns of performing an evaluation and management service by video and the availability of in person appointments. I also discussed with the patient that there may be a patient responsible charge related to this service. The patient expressed understanding and agreed to proceed.    HPI:  Patient and I connected via video to follow-up on her asthma, chronic allergies and some elevated BP readings.  Patient has been having a tougher spring season with all of the pollen in regards to her asthma, has noted more shortness of breath recently.  She continues to use Symbicort twice daily and albuterol as needed.  She is started back on her allergy injections, and is now doing allergy injections 2-3 times a week.  She is hopeful the longer she is on allergy injections the better her asthma will be.  Patient has been increasingly stressed lately related to COVID-19 pandemic.  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  Patient is feeling the pressure of concern over her own health and the health of her patients as well as meeting the demands of her job.  States she checked her BP a few times last week while at work and got readings of 182/111, 153/109, 165/113.  Patient has never had issues with blood pressure being this high, and is wondering if her increased stress and asthma flaring up a little bit could be contributory to higher BP readings.  Denies chest pain or palpitations.  Does have some shortness of breath and wheezing, chronic issue related to her asthma.  Denies GI or GU issues.  Denies body aches.  Denies fever chills.   ROS: See pertinent positives and negatives per HPI.  Past Medical History:  Diagnosis Date  . Asthma   . Chicken pox   . Depression   . GERD (gastroesophageal reflux disease)   . Hay fever   . Seasonal allergies     Past Surgical History:  Procedure Laterality Date  . EAR CYST EXCISION      Family History  Problem Relation Age of Onset  . Diabetes Mother   . Hypertension Mother   . Hyperlipidemia Mother   . Kidney disease Paternal Uncle   . Diabetes Maternal Grandmother   . Hypertension Maternal Grandmother   . Stroke Paternal Grandmother    Social History   Tobacco Use  . Smoking status: Never Smoker  . Smokeless tobacco: Never Used  Substance Use Topics  . Alcohol use: No    Current Outpatient  Medications:  .  albuterol (PROVENTIL HFA;VENTOLIN HFA) 108 (90 Base) MCG/ACT inhaler, Inhale into the lungs every 6 (six) hours as needed for wheezing or shortness of breath., Disp: , Rfl:  .  budesonide-formoterol (SYMBICORT) 160-4.5 MCG/ACT inhaler, Inhale 2 puffs into the lungs 2 (two) times daily., Disp: 1 Inhaler, Rfl: 5 .  cetirizine (ZYRTEC) 10 MG tablet, Take 10 mg by mouth daily., Disp: , Rfl:  .  ergocalciferol (VITAMIN D2) 50000 units capsule, Take 50,000 Units by mouth once a week., Disp: , Rfl:  .  fexofenadine (ALLEGRA)  60 MG tablet, Take 60 mg by mouth 2 (two) times daily., Disp: , Rfl:  .  fluticasone (FLONASE) 50 MCG/ACT nasal spray, Place 1 spray into both nostrils daily., Disp: 16 g, Rfl: 2 .  metoprolol succinate (TOPROL-XL) 50 MG 24 hr tablet, Take 1 tablet (50 mg total) by mouth daily. Take with or immediately following a meal., Disp: 90 tablet, Rfl: 0  EXAM:  GENERAL: alert, oriented, appears well and in no acute distress  HEENT: atraumatic, conjunttiva clear, no obvious abnormalities on inspection of external nose and ears  NECK: normal movements of the head and neck  LUNGS: on inspection no signs of respiratory distress, breathing rate appears normal, no obvious gross SOB, gasping or wheezing  CV: no obvious cyanosis  MS: moves all visible extremities without noticeable abnormality  PSYCH/NEURO: pleasant and cooperative, no obvious depression or anxiety, speech and thought processing grossly intact  ASSESSMENT AND PLAN:  Discussed the following assessment and plan:  Elevated BP without diagnosis of hypertension - Plan: metoprolol succinate (TOPROL-XL) 50 MG 24 hr tablet  Mild persistent asthma, unspecified whether complicated - Plan: budesonide-formoterol (SYMBICORT) 160-4.5 MCG/ACT inhaler  Chronic allergic rhinitis - Plan: budesonide-formoterol (SYMBICORT) 160-4.5 MCG/ACT inhaler  Dyspnea, unspecified type - Plan: metoprolol succinate (TOPROL-XL) 50 MG 24 hr tablet  Stress and adjustment reaction  Elevated BP readings could potentially be due to a underlying hypertension and or related to stress.  Patient is willing to trial metoprolol 50 mg daily for BP control and also hopes this will reduce some shortness of breath she is having as well.  She is wondering also if the increased shortness of breath is related to her increased stress and is hopeful with the Toprol and being on her allergy injections more long-term will help improve all of her symptoms.  Patient has taken fluoxetine in  the past due to increased rest and anxiety.  She restarted back on fluoxetine about 1 week ago, had leftover supply.  Plans to continue fluoxetine for the next few months to see if this helps improve her mood.  Strongly believe a lot of her increased stress is related to the COVID-19 pandemic and it is situational.  Patient advised to send me a MyChart message in about 1 week for follow-up on how her BP readings are looking and how she is feeling overall.  She is aware she can call office sooner if any issues arise.  She is aware to go to emergency room right away if develops shortness of breath that is not helped with use of inhalers, or any other alarm symptoms.   I discussed the assessment and treatment plan with the patient. The patient was provided an opportunity to ask questions and all were answered. The patient agreed with the plan and demonstrated an understanding of the instructions.   The patient was advised to call back or seek an in-person evaluation if the symptoms worsen or if the  condition fails to improve as anticipated.  Tracey HarriesLauren M , FNP

## 2018-11-04 DIAGNOSIS — J301 Allergic rhinitis due to pollen: Secondary | ICD-10-CM | POA: Diagnosis not present

## 2018-11-04 DIAGNOSIS — J3089 Other allergic rhinitis: Secondary | ICD-10-CM | POA: Diagnosis not present

## 2018-11-06 DIAGNOSIS — J3089 Other allergic rhinitis: Secondary | ICD-10-CM | POA: Diagnosis not present

## 2018-11-06 DIAGNOSIS — J301 Allergic rhinitis due to pollen: Secondary | ICD-10-CM | POA: Diagnosis not present

## 2018-11-08 DIAGNOSIS — J3089 Other allergic rhinitis: Secondary | ICD-10-CM | POA: Diagnosis not present

## 2018-11-08 DIAGNOSIS — J3081 Allergic rhinitis due to animal (cat) (dog) hair and dander: Secondary | ICD-10-CM | POA: Diagnosis not present

## 2018-11-08 DIAGNOSIS — J301 Allergic rhinitis due to pollen: Secondary | ICD-10-CM | POA: Diagnosis not present

## 2018-11-13 DIAGNOSIS — J3089 Other allergic rhinitis: Secondary | ICD-10-CM | POA: Diagnosis not present

## 2018-11-13 DIAGNOSIS — J301 Allergic rhinitis due to pollen: Secondary | ICD-10-CM | POA: Diagnosis not present

## 2018-11-15 DIAGNOSIS — J3089 Other allergic rhinitis: Secondary | ICD-10-CM | POA: Diagnosis not present

## 2018-11-15 DIAGNOSIS — J301 Allergic rhinitis due to pollen: Secondary | ICD-10-CM | POA: Diagnosis not present

## 2018-11-20 DIAGNOSIS — J301 Allergic rhinitis due to pollen: Secondary | ICD-10-CM | POA: Diagnosis not present

## 2018-11-20 DIAGNOSIS — J3089 Other allergic rhinitis: Secondary | ICD-10-CM | POA: Diagnosis not present

## 2018-11-20 DIAGNOSIS — J3081 Allergic rhinitis due to animal (cat) (dog) hair and dander: Secondary | ICD-10-CM | POA: Diagnosis not present

## 2018-11-29 DIAGNOSIS — J3089 Other allergic rhinitis: Secondary | ICD-10-CM | POA: Diagnosis not present

## 2018-11-29 DIAGNOSIS — J4540 Moderate persistent asthma, uncomplicated: Secondary | ICD-10-CM | POA: Diagnosis not present

## 2018-11-29 DIAGNOSIS — J301 Allergic rhinitis due to pollen: Secondary | ICD-10-CM | POA: Diagnosis not present

## 2018-11-29 DIAGNOSIS — L503 Dermatographic urticaria: Secondary | ICD-10-CM | POA: Diagnosis not present

## 2019-01-02 DIAGNOSIS — M546 Pain in thoracic spine: Secondary | ICD-10-CM | POA: Diagnosis not present

## 2019-01-02 DIAGNOSIS — M50223 Other cervical disc displacement at C6-C7 level: Secondary | ICD-10-CM | POA: Diagnosis not present

## 2019-01-02 DIAGNOSIS — M542 Cervicalgia: Secondary | ICD-10-CM | POA: Diagnosis not present

## 2019-01-02 DIAGNOSIS — M545 Low back pain: Secondary | ICD-10-CM | POA: Diagnosis not present

## 2019-01-02 DIAGNOSIS — M791 Myalgia, unspecified site: Secondary | ICD-10-CM | POA: Diagnosis not present

## 2019-01-02 DIAGNOSIS — M5124 Other intervertebral disc displacement, thoracic region: Secondary | ICD-10-CM | POA: Diagnosis not present

## 2019-01-06 ENCOUNTER — Other Ambulatory Visit: Payer: Self-pay

## 2019-01-07 DIAGNOSIS — M542 Cervicalgia: Secondary | ICD-10-CM | POA: Diagnosis not present

## 2019-01-07 DIAGNOSIS — M546 Pain in thoracic spine: Secondary | ICD-10-CM | POA: Diagnosis not present

## 2019-01-07 DIAGNOSIS — M50223 Other cervical disc displacement at C6-C7 level: Secondary | ICD-10-CM | POA: Diagnosis not present

## 2019-01-07 DIAGNOSIS — M5124 Other intervertebral disc displacement, thoracic region: Secondary | ICD-10-CM | POA: Diagnosis not present

## 2019-01-07 DIAGNOSIS — M791 Myalgia, unspecified site: Secondary | ICD-10-CM | POA: Diagnosis not present

## 2019-01-09 DIAGNOSIS — M542 Cervicalgia: Secondary | ICD-10-CM | POA: Diagnosis not present

## 2019-01-09 DIAGNOSIS — M791 Myalgia, unspecified site: Secondary | ICD-10-CM | POA: Diagnosis not present

## 2019-01-09 DIAGNOSIS — M50223 Other cervical disc displacement at C6-C7 level: Secondary | ICD-10-CM | POA: Diagnosis not present

## 2019-01-09 DIAGNOSIS — M5124 Other intervertebral disc displacement, thoracic region: Secondary | ICD-10-CM | POA: Diagnosis not present

## 2019-01-09 DIAGNOSIS — M546 Pain in thoracic spine: Secondary | ICD-10-CM | POA: Diagnosis not present

## 2019-01-14 DIAGNOSIS — M542 Cervicalgia: Secondary | ICD-10-CM | POA: Diagnosis not present

## 2019-01-14 DIAGNOSIS — M5124 Other intervertebral disc displacement, thoracic region: Secondary | ICD-10-CM | POA: Diagnosis not present

## 2019-01-14 DIAGNOSIS — M50223 Other cervical disc displacement at C6-C7 level: Secondary | ICD-10-CM | POA: Diagnosis not present

## 2019-01-14 DIAGNOSIS — M546 Pain in thoracic spine: Secondary | ICD-10-CM | POA: Diagnosis not present

## 2019-01-14 DIAGNOSIS — M791 Myalgia, unspecified site: Secondary | ICD-10-CM | POA: Diagnosis not present

## 2019-01-15 DIAGNOSIS — F3340 Major depressive disorder, recurrent, in remission, unspecified: Secondary | ICD-10-CM | POA: Diagnosis not present

## 2019-01-16 DIAGNOSIS — M50223 Other cervical disc displacement at C6-C7 level: Secondary | ICD-10-CM | POA: Diagnosis not present

## 2019-01-16 DIAGNOSIS — M546 Pain in thoracic spine: Secondary | ICD-10-CM | POA: Diagnosis not present

## 2019-01-16 DIAGNOSIS — M5124 Other intervertebral disc displacement, thoracic region: Secondary | ICD-10-CM | POA: Diagnosis not present

## 2019-01-16 DIAGNOSIS — M791 Myalgia, unspecified site: Secondary | ICD-10-CM | POA: Diagnosis not present

## 2019-01-16 DIAGNOSIS — M542 Cervicalgia: Secondary | ICD-10-CM | POA: Diagnosis not present

## 2019-01-17 MED FILL — SYMBICORT 160-4.5 MCG INH: 160-4.5 | 30 days supply | Qty: 10 | Fill #0

## 2019-01-17 MED FILL — buPROPion HCL ER (XL) 150 M: 150 | 30 days supply | Qty: 60 | Fill #0

## 2019-01-21 DIAGNOSIS — M50223 Other cervical disc displacement at C6-C7 level: Secondary | ICD-10-CM | POA: Diagnosis not present

## 2019-01-21 DIAGNOSIS — M542 Cervicalgia: Secondary | ICD-10-CM | POA: Diagnosis not present

## 2019-01-21 DIAGNOSIS — M546 Pain in thoracic spine: Secondary | ICD-10-CM | POA: Diagnosis not present

## 2019-01-21 DIAGNOSIS — M791 Myalgia, unspecified site: Secondary | ICD-10-CM | POA: Diagnosis not present

## 2019-01-21 DIAGNOSIS — M5124 Other intervertebral disc displacement, thoracic region: Secondary | ICD-10-CM | POA: Diagnosis not present

## 2019-01-23 DIAGNOSIS — M546 Pain in thoracic spine: Secondary | ICD-10-CM | POA: Diagnosis not present

## 2019-01-23 DIAGNOSIS — M50223 Other cervical disc displacement at C6-C7 level: Secondary | ICD-10-CM | POA: Diagnosis not present

## 2019-01-23 DIAGNOSIS — M791 Myalgia, unspecified site: Secondary | ICD-10-CM | POA: Diagnosis not present

## 2019-01-23 DIAGNOSIS — M5124 Other intervertebral disc displacement, thoracic region: Secondary | ICD-10-CM | POA: Diagnosis not present

## 2019-01-23 DIAGNOSIS — M542 Cervicalgia: Secondary | ICD-10-CM | POA: Diagnosis not present

## 2019-01-24 ENCOUNTER — Encounter: Payer: Self-pay | Admitting: Family Medicine

## 2019-01-24 ENCOUNTER — Ambulatory Visit (INDEPENDENT_AMBULATORY_CARE_PROVIDER_SITE_OTHER): Payer: 59 | Admitting: Family Medicine

## 2019-01-24 ENCOUNTER — Other Ambulatory Visit (HOSPITAL_COMMUNITY)
Admission: RE | Admit: 2019-01-24 | Discharge: 2019-01-24 | Disposition: A | Discharge status: Home / Self Care | Payer: 59 | Source: Ambulatory Visit | Attending: Family Medicine | Admitting: Family Medicine

## 2019-01-24 ENCOUNTER — Other Ambulatory Visit: Payer: Self-pay

## 2019-01-24 VITALS — BP 122/78 | HR 73 | Temp 98.6°F | Resp 16 | Ht 61.5 in | Wt 224.0 lb

## 2019-01-24 DIAGNOSIS — Z0000 Encounter for general adult medical examination without abnormal findings: Secondary | ICD-10-CM | POA: Diagnosis not present

## 2019-01-24 DIAGNOSIS — Z124 Encounter for screening for malignant neoplasm of cervix: Secondary | ICD-10-CM

## 2019-01-24 DIAGNOSIS — F411 Generalized anxiety disorder: Secondary | ICD-10-CM | POA: Insufficient documentation

## 2019-01-24 DIAGNOSIS — J4530 Mild persistent asthma, uncomplicated: Secondary | ICD-10-CM

## 2019-01-24 DIAGNOSIS — J309 Allergic rhinitis, unspecified: Secondary | ICD-10-CM

## 2019-01-24 DIAGNOSIS — F419 Anxiety disorder, unspecified: Secondary | ICD-10-CM | POA: Diagnosis not present

## 2019-01-24 LAB — BASIC METABOLIC PANEL
BUN: 9 mg/dL (ref 6–23)
CO2: 27 mEq/L (ref 19–32)
Calcium: 9.1 mg/dL (ref 8.4–10.5)
Chloride: 102 mEq/L (ref 96–112)
Creatinine, Ser: 0.78 mg/dL (ref 0.40–1.20)
GFR: 101.78 mL/min (ref >60.00)
Glucose, Bld: 98 mg/dL (ref 70–99)
Potassium: 4 mEq/L (ref 3.5–5.1)
Sodium: 137 mEq/L (ref 135–145)

## 2019-01-24 LAB — CBC WITH DIFFERENTIAL/PLATELET
Basophils Absolute: 0 10*3/uL (ref 0.0–0.1)
Basophils Relative: 0.8 % (ref 0.0–3.0)
Eosinophils Absolute: 0.1 10*3/uL (ref 0.0–0.7)
Eosinophils Relative: 2 % (ref 0.0–5.0)
HCT: 37.5 % (ref 36.0–46.0)
Hemoglobin: 12.7 g/dL (ref 12.0–15.0)
Lymphocytes Relative: 21.6 % (ref 12.0–46.0)
Lymphs Abs: 1.1 10*3/uL (ref 0.7–4.0)
MCHC: 33.8 g/dL (ref 30.0–36.0)
MCV: 89.6 fl (ref 78.0–100.0)
Monocytes Absolute: 0.3 10*3/uL (ref 0.1–1.0)
Monocytes Relative: 5 % (ref 3.0–12.0)
Neutro Abs: 3.7 10*3/uL (ref 1.4–7.7)
Neutrophils Relative %: 70.6 % (ref 43.0–77.0)
Platelets: 211 10*3/uL (ref 150.0–400.0)
RBC: 4.19 Mil/uL (ref 3.87–5.11)
RDW: 13.9 % (ref 11.5–15.5)
WBC: 5.2 10*3/uL (ref 4.0–10.5)

## 2019-01-24 LAB — HEPATIC FUNCTION PANEL
ALT: 15 U/L (ref 0–35)
AST: 16 U/L (ref 0–37)
Albumin: 4.1 g/dL (ref 3.5–5.2)
Alkaline Phosphatase: 65 U/L (ref 39–117)
Bilirubin, Direct: 0.2 mg/dL (ref 0.0–0.3)
Total Bilirubin: 0.7 mg/dL (ref 0.2–1.2)
Total Protein: 6.6 g/dL (ref 6.0–8.3)

## 2019-01-24 LAB — B12 AND FOLATE PANEL
Folate: >23.9 ng/mL (ref >5.9)
Vitamin B-12: 218 pg/mL (ref 211–911)

## 2019-01-24 LAB — VITAMIN D 25 HYDROXY (VIT D DEFICIENCY, FRACTURES): VITD: 22.89 ng/mL — ABNORMAL LOW (ref 30.00–100.00)

## 2019-01-24 MED ORDER — BUPROPION HCL ER (XL) 300 MG PO TB24: 300.0000 mg | ORAL_TABLET | Freq: Every day | ORAL | 1 refills | Status: DC

## 2019-01-24 MED FILL — buPROPion HCL ER (XL) 300 M: 300 | 90 days supply | Qty: 90 | Fill #0

## 2019-01-24 NOTE — Progress Notes (Signed)
Subjective:    Patient ID: Meredith Campbell, female    DOB: Feb 18, 1986, 33 y.o.   MRN: [id number]  HPI   Patient to cllinic for complete physical exam.  Overall she is feeling well.    States the pandemic has been increasingly stressful, but she did end up having an appointment with a psychiatric nurse practitioner and was prescribed Wellbutrin.  States since starting on the Wellbutrin, feels mood is much better.  Still has times of increased stress and anxiety however feels she is handling it well and is pleased with the effects of the Wellbutrin.  Denies any SI or HI.  Also states that she followed up with allergist and they have jointly decided to stop her allergy injections.  Seem to be having a respiratory type response to allergy injections, but now that she is stopped them and is managing allergies with oral antihistamine shortness of breath has resolved.  Patient's goal for the remainder of this year is to improve her diet and lose weight.  She has started going to the [employer] to get fresh fruits and vegetables and is counting calories.  She is also working on walking more.  Patient sees the eye doctor annually and sees a dentist twice per year.   Patient Active Problem List   Diagnosis Date Noted  . Chronic allergic rhinitis 03/28/2018  . Mild persistent asthma 03/28/2018   Social History   Tobacco Use  . Smoking status: Never Smoker  . Smokeless tobacco: Never Used  Substance Use Topics  . Alcohol use: No   Past Surgical History:  Procedure Laterality Date  . EAR CYST EXCISION     Family History  Problem Relation Age of Onset  . Diabetes Mother   . Hypertension Mother   . Hyperlipidemia Mother   . Kidney disease Paternal Uncle   . Diabetes Maternal Grandmother   . Hypertension Maternal Grandmother   . Stroke Paternal Grandmother    Review of Systems  Constitutional: Negative for chills, fatigue and fever.  HENT: Negative for congestion, ear  pain, sinus pain and sore throat.   Eyes: Negative.   Respiratory: Negative for cough, shortness of breath and wheezing.   Cardiovascular: Negative for chest pain, palpitations and leg swelling.  Gastrointestinal: Negative for abdominal pain, diarrhea, nausea and vomiting.  Genitourinary: Negative for dysuria, frequency and urgency.  Musculoskeletal: Negative for arthralgias and myalgias.  Skin: Negative for color change, pallor and rash.  Neurological: Negative for syncope, light-headedness and headaches.  Psychiatric/Behavioral: The patient is not nervous/anxious.       Objective:   Physical Exam Vitals signs and nursing note reviewed.  Constitutional:      General: She is not in acute distress.    Appearance: She is not ill-appearing, toxic-appearing or diaphoretic.  HENT:     Head: Normocephalic and atraumatic.     Right Ear: Tympanic membrane, ear canal and external ear normal.     Left Ear: Tympanic membrane, ear canal and external ear normal.     Nose: Nose normal.     Mouth/Throat:     Mouth: Mucous membranes are moist.  Eyes:     General: No scleral icterus.    Extraocular Movements: Extraocular movements intact.     Conjunctiva/sclera: Conjunctivae normal.     Pupils: Pupils are equal, round, and reactive to light.  Neck:     Musculoskeletal: Normal range of motion and neck supple. No neck rigidity.  Cardiovascular:     Rate and Rhythm:  Normal rate and regular rhythm.     Heart sounds: Normal heart sounds.  Pulmonary:     Effort: Pulmonary effort is normal. No respiratory distress.     Breath sounds: Normal breath sounds. No wheezing, rhonchi or rales.  Abdominal:     General: Bowel sounds are normal. There is no distension.     Palpations: Abdomen is soft. There is no mass.     Tenderness: There is no abdominal tenderness. There is no guarding or rebound.     Hernia: No hernia is present. There is no hernia in the left inguinal area or right inguinal area.   Genitourinary:    Labia:        Right: No rash, tenderness, lesion or injury.        Left: No rash, tenderness, lesion or injury.      Vagina: Normal.     Cervix: Normal.     Uterus: Normal.      Adnexa:        Right: No tenderness.         Left: No tenderness.       Comments: Pap collected and sent to lab Musculoskeletal: Normal range of motion.     Right lower leg: No edema.     Left lower leg: No edema.  Lymphadenopathy:     Lower Body: No right inguinal adenopathy. No left inguinal adenopathy.  Skin:    General: Skin is warm and dry.     Capillary Refill: Capillary refill takes less than 2 seconds.     Coloration: Skin is not jaundiced or pale.  Neurological:     General: No focal deficit present.     Mental Status: She is alert and oriented to person, place, and time.     Gait: Gait normal.  Psychiatric:        Mood and Affect: Mood normal.        Behavior: Behavior normal.        Thought Content: Thought content normal.        Judgment: Judgment normal.    Today's Vitals   01/24/19 0931  BP: 122/78  Pulse: 73  Resp: 16  Temp: 98.6 F (37 C)  TempSrc: Oral  SpO2: 98%  Weight: 224 lb (101.6 kg)  Height: 5' 1.5" (1.562 m)   Body mass index is 41.64 kg/m.   Assessment & Plan:    Well adult exam/cervical cancer screening- we will get screening lab work and Pap smear was performed today.  Encourage patient continue working on [employer] diet choices including lots of lean proteins, various fruits and vegetables and lower processed sugars/carbohydrates.  Encouraged good water intake and regular physical activity of walking and or cardio exercise for at least 30 minutes 4 to 5 days a week and weightlifting.  Patient always wears seatbelt when in car.  Discussed safe sun practices including wearing SPF of at least 30 when outdoors and a widebrimmed hat and/or long sleeves when going to be out in the sun for extended hours.  Patient sees dentist and eye doctor regularly.   Anxiety-anxiety greatly improved with use of Wellbutrin.  She will continue Wellbutrin 300 mg daily.  Allergic rhinitis/asthma-allergy injections seem to be exacerbating patient's symptoms, since they have stopped breathing and allergies feel much better.  Now control seasonal allergies with oral medications.  We will plan to have patient follow-up in clinic in approximately 3 months for recheck on chronic conditions.  She is aware she can return to  clinic sooner if any issues arise.  Also made patient aware that we will have flu vaccines available around September 2020

## 2019-01-25 LAB — THYROID PANEL WITH TSH
Free Thyroxine Index: 2.4 (ref 1.4–3.8)
T3 Uptake: 31 % (ref 22–35)
T4, Total: 7.7 ug/dL (ref 5.1–11.9)
TSH: 1.23 mIU/L

## 2019-01-28 DIAGNOSIS — M5124 Other intervertebral disc displacement, thoracic region: Secondary | ICD-10-CM | POA: Diagnosis not present

## 2019-01-28 DIAGNOSIS — M50223 Other cervical disc displacement at C6-C7 level: Secondary | ICD-10-CM | POA: Diagnosis not present

## 2019-01-28 DIAGNOSIS — M791 Myalgia, unspecified site: Secondary | ICD-10-CM | POA: Diagnosis not present

## 2019-01-28 DIAGNOSIS — M542 Cervicalgia: Secondary | ICD-10-CM | POA: Diagnosis not present

## 2019-01-28 LAB — CYTOLOGY - PAP
Diagnosis: NEGATIVE
HPV: NOT DETECTED

## 2019-01-29 ENCOUNTER — Encounter: Payer: Self-pay | Admitting: Family Medicine

## 2019-01-29 DIAGNOSIS — E559 Vitamin D deficiency, unspecified: Secondary | ICD-10-CM

## 2019-01-30 ENCOUNTER — Other Ambulatory Visit: Payer: Self-pay | Admitting: Family Medicine

## 2019-01-30 DIAGNOSIS — M791 Myalgia, unspecified site: Secondary | ICD-10-CM | POA: Diagnosis not present

## 2019-01-30 DIAGNOSIS — M542 Cervicalgia: Secondary | ICD-10-CM | POA: Diagnosis not present

## 2019-01-30 DIAGNOSIS — M5124 Other intervertebral disc displacement, thoracic region: Secondary | ICD-10-CM | POA: Diagnosis not present

## 2019-01-30 DIAGNOSIS — M50223 Other cervical disc displacement at C6-C7 level: Secondary | ICD-10-CM | POA: Diagnosis not present

## 2019-01-30 MED ORDER — VITAMIN D (ERGOCALCIFEROL) 1.25 MG (50000 UNIT) PO CAPS: 50000.0000 [IU] | ORAL_CAPSULE | ORAL | 0 refills | Status: DC

## 2019-01-30 MED FILL — VIT D2 1.25 MG (50,000 UNIT: 1.25 MG | 84 days supply | Qty: 12 | Fill #0

## 2019-02-04 DIAGNOSIS — M5124 Other intervertebral disc displacement, thoracic region: Secondary | ICD-10-CM | POA: Diagnosis not present

## 2019-02-04 DIAGNOSIS — M542 Cervicalgia: Secondary | ICD-10-CM | POA: Diagnosis not present

## 2019-02-04 DIAGNOSIS — M50223 Other cervical disc displacement at C6-C7 level: Secondary | ICD-10-CM | POA: Diagnosis not present

## 2019-02-04 DIAGNOSIS — M791 Myalgia, unspecified site: Secondary | ICD-10-CM | POA: Diagnosis not present

## 2019-02-06 DIAGNOSIS — M542 Cervicalgia: Secondary | ICD-10-CM | POA: Diagnosis not present

## 2019-02-06 DIAGNOSIS — M791 Myalgia, unspecified site: Secondary | ICD-10-CM | POA: Diagnosis not present

## 2019-02-06 DIAGNOSIS — M50223 Other cervical disc displacement at C6-C7 level: Secondary | ICD-10-CM | POA: Diagnosis not present

## 2019-02-06 DIAGNOSIS — M5124 Other intervertebral disc displacement, thoracic region: Secondary | ICD-10-CM | POA: Diagnosis not present

## 2019-02-13 DIAGNOSIS — M50223 Other cervical disc displacement at C6-C7 level: Secondary | ICD-10-CM | POA: Diagnosis not present

## 2019-02-13 DIAGNOSIS — M791 Myalgia, unspecified site: Secondary | ICD-10-CM | POA: Diagnosis not present

## 2019-02-13 DIAGNOSIS — M5124 Other intervertebral disc displacement, thoracic region: Secondary | ICD-10-CM | POA: Diagnosis not present

## 2019-02-13 DIAGNOSIS — M542 Cervicalgia: Secondary | ICD-10-CM | POA: Diagnosis not present

## 2019-02-25 MED FILL — buPROPion HCL ER (XL) 300 M: 300 | 90 days supply | Qty: 90 | Fill #0

## 2019-03-14 DIAGNOSIS — H1045 Other chronic allergic conjunctivitis: Secondary | ICD-10-CM | POA: Diagnosis not present

## 2019-03-14 DIAGNOSIS — J4530 Mild persistent asthma, uncomplicated: Secondary | ICD-10-CM | POA: Diagnosis not present

## 2019-03-14 DIAGNOSIS — J3081 Allergic rhinitis due to animal (cat) (dog) hair and dander: Secondary | ICD-10-CM | POA: Diagnosis not present

## 2019-03-14 DIAGNOSIS — J301 Allergic rhinitis due to pollen: Secondary | ICD-10-CM | POA: Diagnosis not present

## 2019-03-21 DIAGNOSIS — J3089 Other allergic rhinitis: Secondary | ICD-10-CM | POA: Diagnosis not present

## 2019-03-21 DIAGNOSIS — J3081 Allergic rhinitis due to animal (cat) (dog) hair and dander: Secondary | ICD-10-CM | POA: Diagnosis not present

## 2019-03-21 DIAGNOSIS — J301 Allergic rhinitis due to pollen: Secondary | ICD-10-CM | POA: Diagnosis not present

## 2019-03-24 DIAGNOSIS — J3081 Allergic rhinitis due to animal (cat) (dog) hair and dander: Secondary | ICD-10-CM | POA: Diagnosis not present

## 2019-03-24 DIAGNOSIS — J3089 Other allergic rhinitis: Secondary | ICD-10-CM | POA: Diagnosis not present

## 2019-03-24 DIAGNOSIS — J301 Allergic rhinitis due to pollen: Secondary | ICD-10-CM | POA: Diagnosis not present

## 2019-04-08 DIAGNOSIS — J301 Allergic rhinitis due to pollen: Secondary | ICD-10-CM | POA: Diagnosis not present

## 2019-04-08 DIAGNOSIS — J3081 Allergic rhinitis due to animal (cat) (dog) hair and dander: Secondary | ICD-10-CM | POA: Diagnosis not present

## 2019-04-08 DIAGNOSIS — J3089 Other allergic rhinitis: Secondary | ICD-10-CM | POA: Diagnosis not present

## 2019-04-10 DIAGNOSIS — J3081 Allergic rhinitis due to animal (cat) (dog) hair and dander: Secondary | ICD-10-CM | POA: Diagnosis not present

## 2019-04-10 DIAGNOSIS — J3089 Other allergic rhinitis: Secondary | ICD-10-CM | POA: Diagnosis not present

## 2019-04-10 DIAGNOSIS — J301 Allergic rhinitis due to pollen: Secondary | ICD-10-CM | POA: Diagnosis not present

## 2019-04-14 DIAGNOSIS — J3089 Other allergic rhinitis: Secondary | ICD-10-CM | POA: Diagnosis not present

## 2019-04-14 DIAGNOSIS — J3081 Allergic rhinitis due to animal (cat) (dog) hair and dander: Secondary | ICD-10-CM | POA: Diagnosis not present

## 2019-04-14 DIAGNOSIS — J301 Allergic rhinitis due to pollen: Secondary | ICD-10-CM | POA: Diagnosis not present

## 2019-04-29 ENCOUNTER — Ambulatory Visit (INDEPENDENT_AMBULATORY_CARE_PROVIDER_SITE_OTHER): Payer: 59 | Admitting: Family Medicine

## 2019-04-29 ENCOUNTER — Encounter: Payer: Self-pay | Admitting: Family Medicine

## 2019-04-29 DIAGNOSIS — J4530 Mild persistent asthma, uncomplicated: Secondary | ICD-10-CM

## 2019-04-29 DIAGNOSIS — J309 Allergic rhinitis, unspecified: Secondary | ICD-10-CM

## 2019-04-29 DIAGNOSIS — F419 Anxiety disorder, unspecified: Secondary | ICD-10-CM

## 2019-04-29 MED ORDER — ALBUTEROL SULFATE HFA 108 (90 BASE) MCG/ACT IN AERS: 2.0000 | INHALATION_SPRAY | Freq: Four times a day (QID) | RESPIRATORY_TRACT | 5 refills | Status: DC | PRN

## 2019-04-29 MED ORDER — MONTELUKAST SODIUM 10 MG PO TABS: 10.0000 mg | ORAL_TABLET | Freq: Every day | ORAL | 3 refills | Status: DC

## 2019-04-29 MED ORDER — BUDESONIDE-FORMOTEROL FUMARATE 160-4.5 MCG/ACT IN AERO: 2.0000 | INHALATION_SPRAY | Freq: Two times a day (BID) | RESPIRATORY_TRACT | 5 refills | Status: DC

## 2019-04-29 MED ORDER — BUPROPION HCL ER (XL) 300 MG PO TB24: 300.0000 mg | ORAL_TABLET | Freq: Every day | ORAL | 1 refills | Status: DC

## 2019-04-29 MED FILL — ALBUTEROL SULFATE HFA 108 (: 108 (90 BAS | 25 days supply | Qty: 18 | Fill #0

## 2019-04-29 MED FILL — MONTELUKAST SOD 10 MG TAB: 10 | 90 days supply | Qty: 90 | Fill #0

## 2019-04-29 MED FILL — SYMBICORT 160-4.5 MCG INH: 160-4.5 | 30 days supply | Qty: 10 | Fill #0

## 2019-04-29 NOTE — Progress Notes (Signed)
Patient ID: Meredith Campbell, female   DOB: 1986/03/27, 33 y.o.   MRN: [id number]    Virtual Visit via phone Note  This visit type was conducted due to national recommendations for restrictions regarding the COVID-19 pandemic (e.g. social distancing).  This format is felt to be most appropriate for this patient at this time.  All issues noted in this document were discussed and addressed.  No physical exam was performed (except for noted visual exam findings with Video Visits).   I connected with Meredith Campbell today at  8:40 AM EDT by a video enabled telemedicine application or telephone and verified that I am speaking with the correct person using two identifiers. Location patient: home Location provider: work or home office Persons participating in the virtual visit: patient, provider  I discussed the limitations, risks, security and privacy concerns of performing an evaluation and management service by telephone and the availability of in person appointments. I also discussed with the patient that there may be a patient responsible charge related to this service. The patient expressed understanding and agreed to proceed.  Video visit attempted x3, connection difficulties to get both video and audio working.  Visit completed via telephone.  HPI:  Patient and I connected via video to follow-up on asthma, mood and seasonal allergies.  Asthma is well controlled on Symbicort and allergy medication.  Allergies tend to be a trigger for her asthma.  States that she is taking Singulair at night, Allegra in the morning and has started doing allergy injections again due to having a bad exacerbation when she first moved to [place].  Now she is feeling well.  Has not used albuterol inhaler in many weeks.  Mood is stable on Wellbutrin 300 mg daily.  Does still have times of anxiety, but overall feels she is handling it well.  Continues to work with her counselor every 1 to 2 weeks, they  talk about different coping mechanisms and strategies that she can use to help keep her anxiety and stress level under control.  Denies SI or HI.  No fever or chills.  No chest pain.  No wheezing or shortness of breath.  No body aches.  No nausea, vomiting or diarrhea.   ROS: See pertinent positives and negatives per HPI.  Past Medical History:  Diagnosis Date  . Asthma   . Chicken pox   . Depression   . GERD (gastroesophageal reflux disease)   . Hay fever   . Seasonal allergies     Past Surgical History:  Procedure Laterality Date  . EAR CYST EXCISION      Family History  Problem Relation Age of Onset  . Diabetes Mother   . Hypertension Mother   . Hyperlipidemia Mother   . Kidney disease Paternal Uncle   . Diabetes Maternal Grandmother   . Hypertension Maternal Grandmother   . Stroke Paternal Grandmother    Social History   Tobacco Use  . Smoking status: Never Smoker  . Smokeless tobacco: Never Used  Substance Use Topics  . Alcohol use: No    Current Outpatient Medications:  .  albuterol (PROVENTIL HFA;VENTOLIN HFA) 108 (90 Base) MCG/ACT inhaler, Inhale into the lungs every 6 (six) hours as needed for wheezing or shortness of breath., Disp: , Rfl:  .  budesonide-formoterol (SYMBICORT) 160-4.5 MCG/ACT inhaler, Inhale 2 puffs into the lungs 2 (two) times daily., Disp: 1 Inhaler, Rfl: 5 .  buPROPion (WELLBUTRIN XL) 300 MG 24 hr tablet, Take 1 tablet (300 mg total)  by mouth daily., Disp: 90 tablet, Rfl: 1 .  cetirizine (ZYRTEC) 10 MG tablet, Take 10 mg by mouth daily., Disp: , Rfl:  .  fexofenadine (ALLEGRA) 60 MG tablet, Take 60 mg by mouth 2 (two) times daily., Disp: , Rfl:  .  fluticasone (FLONASE) 50 MCG/ACT nasal spray, Place 1 spray into both nostrils daily., Disp: 16 g, Rfl: 2 .  Vitamin D, Ergocalciferol, (DRISDOL) 1.25 MG (50000 UT) CAPS capsule, Take 1 capsule (50,000 Units total) by mouth every 7 (seven) days., Disp: 12 capsule, Rfl: 0  EXAM:  GENERAL:  alert, oriented, appears well and in no acute distress  HEENT: atraumatic, conjunttiva clear, no obvious abnormalities on inspection of external nose and ears  NECK: normal movements of the head and neck  LUNGS: on inspection no signs of respiratory distress, breathing rate appears normal, no obvious gross SOB, gasping or wheezing  CV: no obvious cyanosis  MS: moves all visible extremities without noticeable abnormality  PSYCH/NEURO: pleasant and cooperative, no obvious depression or anxiety, speech and thought processing grossly intact  ASSESSMENT AND PLAN:  Discussed the following assessment and plan:  Asthma/seasonal allergies-asthma under good control.  Allergies are under control with Singulair, Allegra and allergy injections.  Refills of medications given.  Anxiety-mood well controlled with Wellbutrin.  Also works regularly with counselor.   I discussed the assessment and treatment plan with the patient. The patient was provided an opportunity to ask questions and all were answered. The patient agreed with the plan and demonstrated an understanding of the instructions.   The patient was advised to call back or seek an in-person evaluation if the symptoms worsen or if the condition fails to improve as anticipated.  I provided 15 minutes of non-face-to-face time during this encounter.   Jodelle Green, FNP

## 2019-04-30 DIAGNOSIS — J3089 Other allergic rhinitis: Secondary | ICD-10-CM | POA: Diagnosis not present

## 2019-04-30 DIAGNOSIS — J301 Allergic rhinitis due to pollen: Secondary | ICD-10-CM | POA: Diagnosis not present

## 2019-04-30 DIAGNOSIS — J3081 Allergic rhinitis due to animal (cat) (dog) hair and dander: Secondary | ICD-10-CM | POA: Diagnosis not present

## 2019-05-02 DIAGNOSIS — J301 Allergic rhinitis due to pollen: Secondary | ICD-10-CM | POA: Diagnosis not present

## 2019-05-02 DIAGNOSIS — J3089 Other allergic rhinitis: Secondary | ICD-10-CM | POA: Diagnosis not present

## 2019-05-02 DIAGNOSIS — J3081 Allergic rhinitis due to animal (cat) (dog) hair and dander: Secondary | ICD-10-CM | POA: Diagnosis not present

## 2019-05-06 DIAGNOSIS — J3089 Other allergic rhinitis: Secondary | ICD-10-CM | POA: Diagnosis not present

## 2019-05-06 DIAGNOSIS — J301 Allergic rhinitis due to pollen: Secondary | ICD-10-CM | POA: Diagnosis not present

## 2019-05-06 DIAGNOSIS — J3081 Allergic rhinitis due to animal (cat) (dog) hair and dander: Secondary | ICD-10-CM | POA: Diagnosis not present

## 2019-05-13 DIAGNOSIS — J3089 Other allergic rhinitis: Secondary | ICD-10-CM | POA: Diagnosis not present

## 2019-05-13 DIAGNOSIS — J3081 Allergic rhinitis due to animal (cat) (dog) hair and dander: Secondary | ICD-10-CM | POA: Diagnosis not present

## 2019-05-13 DIAGNOSIS — J301 Allergic rhinitis due to pollen: Secondary | ICD-10-CM | POA: Diagnosis not present

## 2019-05-15 DIAGNOSIS — J301 Allergic rhinitis due to pollen: Secondary | ICD-10-CM | POA: Diagnosis not present

## 2019-05-15 DIAGNOSIS — J3089 Other allergic rhinitis: Secondary | ICD-10-CM | POA: Diagnosis not present

## 2019-05-15 DIAGNOSIS — J3081 Allergic rhinitis due to animal (cat) (dog) hair and dander: Secondary | ICD-10-CM | POA: Diagnosis not present

## 2019-05-20 DIAGNOSIS — J3081 Allergic rhinitis due to animal (cat) (dog) hair and dander: Secondary | ICD-10-CM | POA: Diagnosis not present

## 2019-05-20 DIAGNOSIS — J3089 Other allergic rhinitis: Secondary | ICD-10-CM | POA: Diagnosis not present

## 2019-05-20 DIAGNOSIS — J301 Allergic rhinitis due to pollen: Secondary | ICD-10-CM | POA: Diagnosis not present

## 2019-05-23 DIAGNOSIS — J3081 Allergic rhinitis due to animal (cat) (dog) hair and dander: Secondary | ICD-10-CM | POA: Diagnosis not present

## 2019-05-23 DIAGNOSIS — J3089 Other allergic rhinitis: Secondary | ICD-10-CM | POA: Diagnosis not present

## 2019-05-23 DIAGNOSIS — J301 Allergic rhinitis due to pollen: Secondary | ICD-10-CM | POA: Diagnosis not present

## 2019-05-26 DIAGNOSIS — J301 Allergic rhinitis due to pollen: Secondary | ICD-10-CM | POA: Diagnosis not present

## 2019-05-26 DIAGNOSIS — J3089 Other allergic rhinitis: Secondary | ICD-10-CM | POA: Diagnosis not present

## 2019-05-26 DIAGNOSIS — J3081 Allergic rhinitis due to animal (cat) (dog) hair and dander: Secondary | ICD-10-CM | POA: Diagnosis not present

## 2019-05-28 DIAGNOSIS — J3089 Other allergic rhinitis: Secondary | ICD-10-CM | POA: Diagnosis not present

## 2019-05-28 DIAGNOSIS — J301 Allergic rhinitis due to pollen: Secondary | ICD-10-CM | POA: Diagnosis not present

## 2019-06-03 DIAGNOSIS — J3081 Allergic rhinitis due to animal (cat) (dog) hair and dander: Secondary | ICD-10-CM | POA: Diagnosis not present

## 2019-06-03 DIAGNOSIS — J3089 Other allergic rhinitis: Secondary | ICD-10-CM | POA: Diagnosis not present

## 2019-06-03 DIAGNOSIS — J301 Allergic rhinitis due to pollen: Secondary | ICD-10-CM | POA: Diagnosis not present

## 2019-06-05 DIAGNOSIS — J301 Allergic rhinitis due to pollen: Secondary | ICD-10-CM | POA: Diagnosis not present

## 2019-06-05 DIAGNOSIS — J3081 Allergic rhinitis due to animal (cat) (dog) hair and dander: Secondary | ICD-10-CM | POA: Diagnosis not present

## 2019-06-05 DIAGNOSIS — J3089 Other allergic rhinitis: Secondary | ICD-10-CM | POA: Diagnosis not present

## 2019-06-10 DIAGNOSIS — J3081 Allergic rhinitis due to animal (cat) (dog) hair and dander: Secondary | ICD-10-CM | POA: Diagnosis not present

## 2019-06-10 DIAGNOSIS — J301 Allergic rhinitis due to pollen: Secondary | ICD-10-CM | POA: Diagnosis not present

## 2019-06-10 DIAGNOSIS — J3089 Other allergic rhinitis: Secondary | ICD-10-CM | POA: Diagnosis not present

## 2019-06-12 DIAGNOSIS — J3089 Other allergic rhinitis: Secondary | ICD-10-CM | POA: Diagnosis not present

## 2019-06-12 DIAGNOSIS — J301 Allergic rhinitis due to pollen: Secondary | ICD-10-CM | POA: Diagnosis not present

## 2019-06-12 DIAGNOSIS — J3081 Allergic rhinitis due to animal (cat) (dog) hair and dander: Secondary | ICD-10-CM | POA: Diagnosis not present

## 2019-06-18 DIAGNOSIS — Z20828 Contact with and (suspected) exposure to other viral communicable diseases: Secondary | ICD-10-CM | POA: Diagnosis not present

## 2019-06-25 DIAGNOSIS — J301 Allergic rhinitis due to pollen: Secondary | ICD-10-CM | POA: Diagnosis not present

## 2019-06-25 DIAGNOSIS — J3089 Other allergic rhinitis: Secondary | ICD-10-CM | POA: Diagnosis not present

## 2019-06-25 DIAGNOSIS — J3081 Allergic rhinitis due to animal (cat) (dog) hair and dander: Secondary | ICD-10-CM | POA: Diagnosis not present

## 2019-07-01 DIAGNOSIS — J3081 Allergic rhinitis due to animal (cat) (dog) hair and dander: Secondary | ICD-10-CM | POA: Diagnosis not present

## 2019-07-01 DIAGNOSIS — J301 Allergic rhinitis due to pollen: Secondary | ICD-10-CM | POA: Diagnosis not present

## 2019-07-01 DIAGNOSIS — J3089 Other allergic rhinitis: Secondary | ICD-10-CM | POA: Diagnosis not present

## 2019-07-03 DIAGNOSIS — J3081 Allergic rhinitis due to animal (cat) (dog) hair and dander: Secondary | ICD-10-CM | POA: Diagnosis not present

## 2019-07-03 DIAGNOSIS — J3089 Other allergic rhinitis: Secondary | ICD-10-CM | POA: Diagnosis not present

## 2019-07-03 DIAGNOSIS — J301 Allergic rhinitis due to pollen: Secondary | ICD-10-CM | POA: Diagnosis not present

## 2019-07-07 DIAGNOSIS — J301 Allergic rhinitis due to pollen: Secondary | ICD-10-CM | POA: Diagnosis not present

## 2019-07-07 DIAGNOSIS — J3081 Allergic rhinitis due to animal (cat) (dog) hair and dander: Secondary | ICD-10-CM | POA: Diagnosis not present

## 2019-07-07 DIAGNOSIS — J3089 Other allergic rhinitis: Secondary | ICD-10-CM | POA: Diagnosis not present

## 2019-07-09 DIAGNOSIS — J301 Allergic rhinitis due to pollen: Secondary | ICD-10-CM | POA: Diagnosis not present

## 2019-07-09 DIAGNOSIS — J3089 Other allergic rhinitis: Secondary | ICD-10-CM | POA: Diagnosis not present

## 2019-07-09 DIAGNOSIS — J3081 Allergic rhinitis due to animal (cat) (dog) hair and dander: Secondary | ICD-10-CM | POA: Diagnosis not present

## 2019-07-29 ENCOUNTER — Telehealth: Payer: Self-pay | Admitting: Family

## 2019-07-29 NOTE — Telephone Encounter (Signed)
I would forward this to [facility] since this was a Meredith Campbell pt.but he is out of office. Pt is not establishing care with you either & I said that I was sure that you would want an appointment. Not sure when new NP is starting to get patient scheduled with her either? Not really sure what to advise & I hate to to take up a same day appointment for this?

## 2019-07-29 NOTE — Telephone Encounter (Signed)
Pt states that she believes the welbutrin is making her anxiety worse. Please advise.

## 2019-07-30 DIAGNOSIS — J3081 Allergic rhinitis due to animal (cat) (dog) hair and dander: Secondary | ICD-10-CM | POA: Diagnosis not present

## 2019-07-30 DIAGNOSIS — J3089 Other allergic rhinitis: Secondary | ICD-10-CM | POA: Diagnosis not present

## 2019-07-30 DIAGNOSIS — J301 Allergic rhinitis due to pollen: Secondary | ICD-10-CM | POA: Diagnosis not present

## 2019-07-30 NOTE — Telephone Encounter (Signed)
I spoke with patient & she is scheduled with you 2/9 @ 11:30. She is having no thoughts of hurting herself or anyone else. She said that medication has helped with depression it just makes he feel more anxious. She wanted to discuss possibly adding a SSRI.

## 2019-07-30 NOTE — Telephone Encounter (Signed)
Call pt Advise that I am covering for [facility] this week Yes, wellbutrin is typically used for depression. It is quite activating so it can certainly make some people more anxious.  I was confused by message; I would advise that she makes an appt with myself of [facility] to discuss alternative medications or adding a medication to the wellbutrin.  Ensure no thoughts of hurting herself or anyone else   Please sch an appt, same day is fine if that is all I have

## 2019-07-30 NOTE — Telephone Encounter (Signed)
noted 

## 2019-08-01 DIAGNOSIS — J3089 Other allergic rhinitis: Secondary | ICD-10-CM | POA: Diagnosis not present

## 2019-08-01 DIAGNOSIS — J3081 Allergic rhinitis due to animal (cat) (dog) hair and dander: Secondary | ICD-10-CM | POA: Diagnosis not present

## 2019-08-01 DIAGNOSIS — F3340 Major depressive disorder, recurrent, in remission, unspecified: Secondary | ICD-10-CM | POA: Diagnosis not present

## 2019-08-01 DIAGNOSIS — J301 Allergic rhinitis due to pollen: Secondary | ICD-10-CM | POA: Diagnosis not present

## 2019-08-01 MED FILL — HYDROXYZINE PAMOATE 25 MG C: 25 | 7 days supply | Qty: 60 | Fill #0

## 2019-08-01 MED FILL — SYMBICORT 160-4.5 MCG INH: 160-4.5 | 30 days supply | Qty: 10 | Fill #1

## 2019-08-01 MED FILL — SERTRALINE HCL 50 MG TABLET: 50 | 33 days supply | Qty: 30 | Fill #0

## 2019-08-01 MED FILL — BUPROPION HCL XL 300 MG TAB: 300 | 30 days supply | Qty: 30 | Fill #0

## 2019-08-01 MED FILL — LORazepam 0.5 MG TABS: 0.5 | 7 days supply | Qty: 30 | Fill #0

## 2019-08-01 MED FILL — MONTELUKAST SOD 10 MG TAB: 10 | 90 days supply | Qty: 90 | Fill #1

## 2019-08-05 DIAGNOSIS — J3089 Other allergic rhinitis: Secondary | ICD-10-CM | POA: Diagnosis not present

## 2019-08-05 DIAGNOSIS — J301 Allergic rhinitis due to pollen: Secondary | ICD-10-CM | POA: Diagnosis not present

## 2019-08-05 DIAGNOSIS — J3081 Allergic rhinitis due to animal (cat) (dog) hair and dander: Secondary | ICD-10-CM | POA: Diagnosis not present

## 2019-08-07 DIAGNOSIS — J301 Allergic rhinitis due to pollen: Secondary | ICD-10-CM | POA: Diagnosis not present

## 2019-08-07 DIAGNOSIS — J3081 Allergic rhinitis due to animal (cat) (dog) hair and dander: Secondary | ICD-10-CM | POA: Diagnosis not present

## 2019-08-07 DIAGNOSIS — J3089 Other allergic rhinitis: Secondary | ICD-10-CM | POA: Diagnosis not present

## 2019-08-12 ENCOUNTER — Ambulatory Visit: Payer: 59 | Admitting: Family

## 2019-08-13 DIAGNOSIS — H1045 Other chronic allergic conjunctivitis: Secondary | ICD-10-CM | POA: Diagnosis not present

## 2019-08-13 DIAGNOSIS — J3081 Allergic rhinitis due to animal (cat) (dog) hair and dander: Secondary | ICD-10-CM | POA: Diagnosis not present

## 2019-08-13 DIAGNOSIS — J301 Allergic rhinitis due to pollen: Secondary | ICD-10-CM | POA: Diagnosis not present

## 2019-08-13 DIAGNOSIS — J4530 Mild persistent asthma, uncomplicated: Secondary | ICD-10-CM | POA: Diagnosis not present

## 2019-08-13 MED FILL — AEROCHAMBER: 20 days supply | Qty: 1 | Fill #0

## 2019-08-19 DIAGNOSIS — J3081 Allergic rhinitis due to animal (cat) (dog) hair and dander: Secondary | ICD-10-CM | POA: Diagnosis not present

## 2019-08-19 DIAGNOSIS — J3089 Other allergic rhinitis: Secondary | ICD-10-CM | POA: Diagnosis not present

## 2019-08-19 DIAGNOSIS — J301 Allergic rhinitis due to pollen: Secondary | ICD-10-CM | POA: Diagnosis not present

## 2019-08-19 MED FILL — ALBUTEROL SULFATE HFA 108 (: 108 (90 BAS | 25 days supply | Qty: 18 | Fill #1

## 2019-08-22 DIAGNOSIS — J301 Allergic rhinitis due to pollen: Secondary | ICD-10-CM | POA: Diagnosis not present

## 2019-08-27 ENCOUNTER — Ambulatory Visit: Payer: 59 | Admitting: Family

## 2019-08-27 DIAGNOSIS — J301 Allergic rhinitis due to pollen: Secondary | ICD-10-CM | POA: Diagnosis not present

## 2019-08-28 DIAGNOSIS — F3340 Major depressive disorder, recurrent, in remission, unspecified: Secondary | ICD-10-CM | POA: Diagnosis not present

## 2019-08-28 MED FILL — BuPROPion HCL ER (XL) 300 M: 300 | 30 days supply | Qty: 30 | Fill #0

## 2019-08-28 MED FILL — SERTRALINE HCL 100 MG TAB: 100 | 30 days supply | Qty: 30 | Fill #0

## 2019-08-28 MED FILL — LORazepam 0.5 MG TABS: 0.5 | 7 days supply | Qty: 30 | Fill #0

## 2019-08-28 MED FILL — ESZOPICLONE 2 MG TAB: 2 | 30 days supply | Qty: 30 | Fill #0

## 2019-09-02 DIAGNOSIS — J3081 Allergic rhinitis due to animal (cat) (dog) hair and dander: Secondary | ICD-10-CM | POA: Diagnosis not present

## 2019-09-02 DIAGNOSIS — J3089 Other allergic rhinitis: Secondary | ICD-10-CM | POA: Diagnosis not present

## 2019-09-02 DIAGNOSIS — J301 Allergic rhinitis due to pollen: Secondary | ICD-10-CM | POA: Diagnosis not present

## 2019-09-04 DIAGNOSIS — J301 Allergic rhinitis due to pollen: Secondary | ICD-10-CM | POA: Diagnosis not present

## 2019-09-16 DIAGNOSIS — J301 Allergic rhinitis due to pollen: Secondary | ICD-10-CM | POA: Diagnosis not present

## 2019-09-16 DIAGNOSIS — J3081 Allergic rhinitis due to animal (cat) (dog) hair and dander: Secondary | ICD-10-CM | POA: Diagnosis not present

## 2019-09-16 DIAGNOSIS — J3089 Other allergic rhinitis: Secondary | ICD-10-CM | POA: Diagnosis not present

## 2019-09-23 DIAGNOSIS — J301 Allergic rhinitis due to pollen: Secondary | ICD-10-CM | POA: Diagnosis not present

## 2019-09-23 DIAGNOSIS — J3081 Allergic rhinitis due to animal (cat) (dog) hair and dander: Secondary | ICD-10-CM | POA: Diagnosis not present

## 2019-09-23 DIAGNOSIS — J3089 Other allergic rhinitis: Secondary | ICD-10-CM | POA: Diagnosis not present

## 2019-10-01 DIAGNOSIS — J3081 Allergic rhinitis due to animal (cat) (dog) hair and dander: Secondary | ICD-10-CM | POA: Diagnosis not present

## 2019-10-01 DIAGNOSIS — J301 Allergic rhinitis due to pollen: Secondary | ICD-10-CM | POA: Diagnosis not present

## 2019-10-01 DIAGNOSIS — J3089 Other allergic rhinitis: Secondary | ICD-10-CM | POA: Diagnosis not present

## 2019-10-08 DIAGNOSIS — J3081 Allergic rhinitis due to animal (cat) (dog) hair and dander: Secondary | ICD-10-CM | POA: Diagnosis not present

## 2019-10-08 DIAGNOSIS — J301 Allergic rhinitis due to pollen: Secondary | ICD-10-CM | POA: Diagnosis not present

## 2019-10-08 DIAGNOSIS — J3089 Other allergic rhinitis: Secondary | ICD-10-CM | POA: Diagnosis not present

## 2019-10-09 DIAGNOSIS — J3081 Allergic rhinitis due to animal (cat) (dog) hair and dander: Secondary | ICD-10-CM | POA: Diagnosis not present

## 2019-10-09 DIAGNOSIS — J3089 Other allergic rhinitis: Secondary | ICD-10-CM | POA: Diagnosis not present

## 2019-10-15 DIAGNOSIS — J3081 Allergic rhinitis due to animal (cat) (dog) hair and dander: Secondary | ICD-10-CM | POA: Diagnosis not present

## 2019-10-15 DIAGNOSIS — J301 Allergic rhinitis due to pollen: Secondary | ICD-10-CM | POA: Diagnosis not present

## 2019-10-15 DIAGNOSIS — J3089 Other allergic rhinitis: Secondary | ICD-10-CM | POA: Diagnosis not present

## 2019-10-21 DIAGNOSIS — J301 Allergic rhinitis due to pollen: Secondary | ICD-10-CM | POA: Diagnosis not present

## 2019-10-21 DIAGNOSIS — J3089 Other allergic rhinitis: Secondary | ICD-10-CM | POA: Diagnosis not present

## 2019-10-21 DIAGNOSIS — J3081 Allergic rhinitis due to animal (cat) (dog) hair and dander: Secondary | ICD-10-CM | POA: Diagnosis not present

## 2019-10-29 DIAGNOSIS — J3089 Other allergic rhinitis: Secondary | ICD-10-CM | POA: Diagnosis not present

## 2019-10-29 DIAGNOSIS — J3081 Allergic rhinitis due to animal (cat) (dog) hair and dander: Secondary | ICD-10-CM | POA: Diagnosis not present

## 2019-10-29 DIAGNOSIS — J301 Allergic rhinitis due to pollen: Secondary | ICD-10-CM | POA: Diagnosis not present

## 2019-10-31 DIAGNOSIS — J3081 Allergic rhinitis due to animal (cat) (dog) hair and dander: Secondary | ICD-10-CM | POA: Diagnosis not present

## 2019-10-31 DIAGNOSIS — J3089 Other allergic rhinitis: Secondary | ICD-10-CM | POA: Diagnosis not present

## 2019-10-31 DIAGNOSIS — J301 Allergic rhinitis due to pollen: Secondary | ICD-10-CM | POA: Diagnosis not present

## 2019-11-05 DIAGNOSIS — J3081 Allergic rhinitis due to animal (cat) (dog) hair and dander: Secondary | ICD-10-CM | POA: Diagnosis not present

## 2019-11-05 DIAGNOSIS — J3089 Other allergic rhinitis: Secondary | ICD-10-CM | POA: Diagnosis not present

## 2019-11-05 DIAGNOSIS — J301 Allergic rhinitis due to pollen: Secondary | ICD-10-CM | POA: Diagnosis not present

## 2019-11-11 DIAGNOSIS — J301 Allergic rhinitis due to pollen: Secondary | ICD-10-CM | POA: Diagnosis not present

## 2019-11-11 DIAGNOSIS — J3089 Other allergic rhinitis: Secondary | ICD-10-CM | POA: Diagnosis not present

## 2019-11-11 DIAGNOSIS — J3081 Allergic rhinitis due to animal (cat) (dog) hair and dander: Secondary | ICD-10-CM | POA: Diagnosis not present

## 2019-11-19 DIAGNOSIS — J3081 Allergic rhinitis due to animal (cat) (dog) hair and dander: Secondary | ICD-10-CM | POA: Diagnosis not present

## 2019-11-19 DIAGNOSIS — J3089 Other allergic rhinitis: Secondary | ICD-10-CM | POA: Diagnosis not present

## 2019-11-19 DIAGNOSIS — J301 Allergic rhinitis due to pollen: Secondary | ICD-10-CM | POA: Diagnosis not present

## 2019-11-25 DIAGNOSIS — J3081 Allergic rhinitis due to animal (cat) (dog) hair and dander: Secondary | ICD-10-CM | POA: Diagnosis not present

## 2019-11-25 DIAGNOSIS — J301 Allergic rhinitis due to pollen: Secondary | ICD-10-CM | POA: Diagnosis not present

## 2019-11-25 DIAGNOSIS — J3089 Other allergic rhinitis: Secondary | ICD-10-CM | POA: Diagnosis not present

## 2019-12-09 DIAGNOSIS — J3089 Other allergic rhinitis: Secondary | ICD-10-CM | POA: Diagnosis not present

## 2019-12-09 DIAGNOSIS — J301 Allergic rhinitis due to pollen: Secondary | ICD-10-CM | POA: Diagnosis not present

## 2019-12-09 DIAGNOSIS — J3081 Allergic rhinitis due to animal (cat) (dog) hair and dander: Secondary | ICD-10-CM | POA: Diagnosis not present

## 2019-12-22 MED FILL — SERTRALINE HCL 100 MG TAB: 100 | 30 days supply | Qty: 30 | Fill #1

## 2019-12-22 MED FILL — buPROPion HCL ER (XL) 300 M: 300 | 90 days supply | Qty: 90 | Fill #1

## 2019-12-22 MED FILL — SYMBICORT 160-4.5 MCG INH: 160-4.5 | 30 days supply | Qty: 10 | Fill #2

## 2020-01-01 DIAGNOSIS — J3081 Allergic rhinitis due to animal (cat) (dog) hair and dander: Secondary | ICD-10-CM | POA: Diagnosis not present

## 2020-01-01 DIAGNOSIS — J301 Allergic rhinitis due to pollen: Secondary | ICD-10-CM | POA: Diagnosis not present

## 2020-01-01 DIAGNOSIS — J3089 Other allergic rhinitis: Secondary | ICD-10-CM | POA: Diagnosis not present

## 2020-01-03 MED FILL — LORazepam 0.5 MG TABS: 0.5 | 7 days supply | Qty: 30 | Fill #1

## 2020-01-06 MED FILL — EPINEPHRINE 0.3 MG AUTO-INJ: 0.3 | 30 days supply | Qty: 2 | Fill #0

## 2020-01-09 DIAGNOSIS — J3081 Allergic rhinitis due to animal (cat) (dog) hair and dander: Secondary | ICD-10-CM | POA: Diagnosis not present

## 2020-01-09 DIAGNOSIS — J3089 Other allergic rhinitis: Secondary | ICD-10-CM | POA: Diagnosis not present

## 2020-01-09 DIAGNOSIS — J301 Allergic rhinitis due to pollen: Secondary | ICD-10-CM | POA: Diagnosis not present

## 2020-01-13 DIAGNOSIS — J3081 Allergic rhinitis due to animal (cat) (dog) hair and dander: Secondary | ICD-10-CM | POA: Diagnosis not present

## 2020-01-13 DIAGNOSIS — J301 Allergic rhinitis due to pollen: Secondary | ICD-10-CM | POA: Diagnosis not present

## 2020-01-13 DIAGNOSIS — J3089 Other allergic rhinitis: Secondary | ICD-10-CM | POA: Diagnosis not present

## 2020-01-20 DIAGNOSIS — J301 Allergic rhinitis due to pollen: Secondary | ICD-10-CM | POA: Diagnosis not present

## 2020-01-20 DIAGNOSIS — J3081 Allergic rhinitis due to animal (cat) (dog) hair and dander: Secondary | ICD-10-CM | POA: Diagnosis not present

## 2020-01-20 DIAGNOSIS — J3089 Other allergic rhinitis: Secondary | ICD-10-CM | POA: Diagnosis not present

## 2020-02-03 DIAGNOSIS — J301 Allergic rhinitis due to pollen: Secondary | ICD-10-CM | POA: Diagnosis not present

## 2020-02-03 DIAGNOSIS — J3089 Other allergic rhinitis: Secondary | ICD-10-CM | POA: Diagnosis not present

## 2020-02-10 DIAGNOSIS — J3089 Other allergic rhinitis: Secondary | ICD-10-CM | POA: Diagnosis not present

## 2020-02-10 DIAGNOSIS — J301 Allergic rhinitis due to pollen: Secondary | ICD-10-CM | POA: Diagnosis not present

## 2020-02-10 DIAGNOSIS — J3081 Allergic rhinitis due to animal (cat) (dog) hair and dander: Secondary | ICD-10-CM | POA: Diagnosis not present

## 2020-02-17 DIAGNOSIS — J301 Allergic rhinitis due to pollen: Secondary | ICD-10-CM | POA: Diagnosis not present

## 2020-02-17 DIAGNOSIS — J3081 Allergic rhinitis due to animal (cat) (dog) hair and dander: Secondary | ICD-10-CM | POA: Diagnosis not present

## 2020-02-17 DIAGNOSIS — J3089 Other allergic rhinitis: Secondary | ICD-10-CM | POA: Diagnosis not present

## 2020-02-25 DIAGNOSIS — J3081 Allergic rhinitis due to animal (cat) (dog) hair and dander: Secondary | ICD-10-CM | POA: Diagnosis not present

## 2020-02-25 DIAGNOSIS — J3089 Other allergic rhinitis: Secondary | ICD-10-CM | POA: Diagnosis not present

## 2020-02-25 DIAGNOSIS — J301 Allergic rhinitis due to pollen: Secondary | ICD-10-CM | POA: Diagnosis not present

## 2020-03-03 DIAGNOSIS — J301 Allergic rhinitis due to pollen: Secondary | ICD-10-CM | POA: Diagnosis not present

## 2020-03-03 DIAGNOSIS — J3089 Other allergic rhinitis: Secondary | ICD-10-CM | POA: Diagnosis not present

## 2020-03-03 DIAGNOSIS — J3081 Allergic rhinitis due to animal (cat) (dog) hair and dander: Secondary | ICD-10-CM | POA: Diagnosis not present

## 2020-03-10 DIAGNOSIS — J3089 Other allergic rhinitis: Secondary | ICD-10-CM | POA: Diagnosis not present

## 2020-03-10 DIAGNOSIS — J301 Allergic rhinitis due to pollen: Secondary | ICD-10-CM | POA: Diagnosis not present

## 2020-03-10 DIAGNOSIS — J3081 Allergic rhinitis due to animal (cat) (dog) hair and dander: Secondary | ICD-10-CM | POA: Diagnosis not present

## 2020-03-19 DIAGNOSIS — J301 Allergic rhinitis due to pollen: Secondary | ICD-10-CM | POA: Diagnosis not present

## 2020-03-19 DIAGNOSIS — J3089 Other allergic rhinitis: Secondary | ICD-10-CM | POA: Diagnosis not present

## 2020-03-19 DIAGNOSIS — J3081 Allergic rhinitis due to animal (cat) (dog) hair and dander: Secondary | ICD-10-CM | POA: Diagnosis not present

## 2020-03-23 DIAGNOSIS — J301 Allergic rhinitis due to pollen: Secondary | ICD-10-CM | POA: Diagnosis not present

## 2020-03-23 DIAGNOSIS — J3089 Other allergic rhinitis: Secondary | ICD-10-CM | POA: Diagnosis not present

## 2020-03-23 DIAGNOSIS — J3081 Allergic rhinitis due to animal (cat) (dog) hair and dander: Secondary | ICD-10-CM | POA: Diagnosis not present

## 2020-03-26 DIAGNOSIS — J301 Allergic rhinitis due to pollen: Secondary | ICD-10-CM | POA: Diagnosis not present

## 2020-03-26 DIAGNOSIS — J4530 Mild persistent asthma, uncomplicated: Secondary | ICD-10-CM | POA: Diagnosis not present

## 2020-03-26 DIAGNOSIS — H1045 Other chronic allergic conjunctivitis: Secondary | ICD-10-CM | POA: Diagnosis not present

## 2020-03-26 DIAGNOSIS — J3081 Allergic rhinitis due to animal (cat) (dog) hair and dander: Secondary | ICD-10-CM | POA: Diagnosis not present

## 2020-03-26 MED FILL — MONTELUKAST SOD 10 MG TAB: 10 | 90 days supply | Qty: 90 | Fill #0

## 2020-03-26 MED FILL — SYMBICORT 160-4.5 MCG INH: 160-4.5 | 30 days supply | Qty: 10 | Fill #0

## 2020-03-29 DIAGNOSIS — J3089 Other allergic rhinitis: Secondary | ICD-10-CM | POA: Diagnosis not present

## 2020-03-29 DIAGNOSIS — J3081 Allergic rhinitis due to animal (cat) (dog) hair and dander: Secondary | ICD-10-CM | POA: Diagnosis not present

## 2020-03-30 DIAGNOSIS — J3081 Allergic rhinitis due to animal (cat) (dog) hair and dander: Secondary | ICD-10-CM | POA: Diagnosis not present

## 2020-03-30 DIAGNOSIS — J301 Allergic rhinitis due to pollen: Secondary | ICD-10-CM | POA: Diagnosis not present

## 2020-03-30 DIAGNOSIS — J3089 Other allergic rhinitis: Secondary | ICD-10-CM | POA: Diagnosis not present

## 2020-04-07 DIAGNOSIS — J3081 Allergic rhinitis due to animal (cat) (dog) hair and dander: Secondary | ICD-10-CM | POA: Diagnosis not present

## 2020-04-07 DIAGNOSIS — J3089 Other allergic rhinitis: Secondary | ICD-10-CM | POA: Diagnosis not present

## 2020-04-07 DIAGNOSIS — J301 Allergic rhinitis due to pollen: Secondary | ICD-10-CM | POA: Diagnosis not present

## 2020-04-14 DIAGNOSIS — J3089 Other allergic rhinitis: Secondary | ICD-10-CM | POA: Diagnosis not present

## 2020-04-14 DIAGNOSIS — J3081 Allergic rhinitis due to animal (cat) (dog) hair and dander: Secondary | ICD-10-CM | POA: Diagnosis not present

## 2020-04-14 DIAGNOSIS — J301 Allergic rhinitis due to pollen: Secondary | ICD-10-CM | POA: Diagnosis not present

## 2020-04-22 DIAGNOSIS — J3081 Allergic rhinitis due to animal (cat) (dog) hair and dander: Secondary | ICD-10-CM | POA: Diagnosis not present

## 2020-04-22 DIAGNOSIS — J301 Allergic rhinitis due to pollen: Secondary | ICD-10-CM | POA: Diagnosis not present

## 2020-04-22 DIAGNOSIS — J3089 Other allergic rhinitis: Secondary | ICD-10-CM | POA: Diagnosis not present

## 2020-04-29 DIAGNOSIS — J3081 Allergic rhinitis due to animal (cat) (dog) hair and dander: Secondary | ICD-10-CM | POA: Diagnosis not present

## 2020-04-29 DIAGNOSIS — J301 Allergic rhinitis due to pollen: Secondary | ICD-10-CM | POA: Diagnosis not present

## 2020-04-29 DIAGNOSIS — J3089 Other allergic rhinitis: Secondary | ICD-10-CM | POA: Diagnosis not present

## 2020-05-06 DIAGNOSIS — J3081 Allergic rhinitis due to animal (cat) (dog) hair and dander: Secondary | ICD-10-CM | POA: Diagnosis not present

## 2020-05-06 DIAGNOSIS — J3089 Other allergic rhinitis: Secondary | ICD-10-CM | POA: Diagnosis not present

## 2020-05-06 DIAGNOSIS — J301 Allergic rhinitis due to pollen: Secondary | ICD-10-CM | POA: Diagnosis not present

## 2020-05-14 DIAGNOSIS — J301 Allergic rhinitis due to pollen: Secondary | ICD-10-CM | POA: Diagnosis not present

## 2020-05-14 DIAGNOSIS — J3089 Other allergic rhinitis: Secondary | ICD-10-CM | POA: Diagnosis not present

## 2020-05-14 DIAGNOSIS — J3081 Allergic rhinitis due to animal (cat) (dog) hair and dander: Secondary | ICD-10-CM | POA: Diagnosis not present

## 2020-05-20 DIAGNOSIS — J301 Allergic rhinitis due to pollen: Secondary | ICD-10-CM | POA: Diagnosis not present

## 2020-05-20 DIAGNOSIS — J3089 Other allergic rhinitis: Secondary | ICD-10-CM | POA: Diagnosis not present

## 2020-05-20 DIAGNOSIS — J3081 Allergic rhinitis due to animal (cat) (dog) hair and dander: Secondary | ICD-10-CM | POA: Diagnosis not present

## 2020-05-25 ENCOUNTER — Ambulatory Visit (INDEPENDENT_AMBULATORY_CARE_PROVIDER_SITE_OTHER): Payer: 59 | Admitting: Family Medicine

## 2020-05-25 ENCOUNTER — Other Ambulatory Visit: Payer: Self-pay | Admitting: Family Medicine

## 2020-05-25 ENCOUNTER — Encounter: Payer: Self-pay | Admitting: Family Medicine

## 2020-05-25 ENCOUNTER — Other Ambulatory Visit: Payer: Self-pay

## 2020-05-25 VITALS — BP 122/80 | HR 67 | Temp 97.5°F | Ht 62.5 in | Wt 230.5 lb

## 2020-05-25 DIAGNOSIS — F331 Major depressive disorder, recurrent, moderate: Secondary | ICD-10-CM

## 2020-05-25 DIAGNOSIS — G4700 Insomnia, unspecified: Secondary | ICD-10-CM

## 2020-05-25 DIAGNOSIS — F411 Generalized anxiety disorder: Secondary | ICD-10-CM | POA: Diagnosis not present

## 2020-05-25 DIAGNOSIS — R5383 Other fatigue: Secondary | ICD-10-CM

## 2020-05-25 DIAGNOSIS — E559 Vitamin D deficiency, unspecified: Secondary | ICD-10-CM | POA: Insufficient documentation

## 2020-05-25 DIAGNOSIS — F325 Major depressive disorder, single episode, in full remission: Secondary | ICD-10-CM | POA: Insufficient documentation

## 2020-05-25 DIAGNOSIS — F339 Major depressive disorder, recurrent, unspecified: Secondary | ICD-10-CM | POA: Insufficient documentation

## 2020-05-25 DIAGNOSIS — F419 Anxiety disorder, unspecified: Secondary | ICD-10-CM | POA: Diagnosis not present

## 2020-05-25 MED ORDER — SERTRALINE HCL 100 MG PO TABS: 100.0000 mg | ORAL_TABLET | Freq: Every morning | ORAL | 1 refills | Status: DC

## 2020-05-25 MED ORDER — TRAZODONE HCL 50 MG PO TABS: 25.0000 mg | ORAL_TABLET | Freq: Every evening | ORAL | 3 refills | Status: DC | PRN

## 2020-05-25 MED FILL — traZODone HCL 50 MG TABS: 50 | 30 days supply | Qty: 30 | Fill #0

## 2020-05-25 MED FILL — SERTRALINE HCL 100 MG TABS: 100 | 90 days supply | Qty: 90 | Fill #0

## 2020-05-25 NOTE — Patient Instructions (Signed)
Trazodone for sleep  - can increase to 100 mg as needed   Sertraline start with 1/2 tablet for 1-2 weeks and then increase to 100 mg  Return in 6 weeks for check

## 2020-05-25 NOTE — Assessment & Plan Note (Signed)
Suspect this is multifactorial - worsening depression/anxiety as well as insomnia. Will check labs to rule out other causes. Treat mood and insomnia. Also will consider sleep apnea evaluation if not improving.

## 2020-05-25 NOTE — Progress Notes (Signed)
Subjective:     Meredith Campbell is a 34 y.o. female presenting for Establish Care, Anxiety (x 2-3 months ), and Fatigue (x 2-3 months )     HPI   #Anxiety - started medication about 2 years ago - has been off medication for several months after a period of doing well - symptoms of anxiety worse over the last 2-3 months - endorses depression as well, but anxiety is worse - initially started on Wellbutrin  - had difficulty getting in with PCP so [facility] - where she was started on wellbutrin and then had zoloft  Initially was started on ativan initially until getting good response  Sleep: 4 hours a night - maybe 5 hours - no major change with medication - difficulty falling asleep - has had sleep study done years ago - which was normal, but was exercising and eating better at that time - difficulty relaxing - tried melatonin, [employer] app  Review of Systems   Social History   Tobacco Use  Smoking Status Never Smoker  Smokeless Tobacco Never Used        Objective:    BP Readings from Last 3 Encounters:  05/25/20 122/80  01/24/19 122/78  08/01/18 118/76   Wt Readings from Last 3 Encounters:  05/25/20 230 lb 8 oz (104.6 kg)  04/29/19 224 lb (101.6 kg)  01/24/19 224 lb (101.6 kg)    BP 122/80   Pulse 67   Temp (!) 97.5 F (36.4 C) (Temporal)   Ht 5' 2.5" (1.588 m)   Wt 230 lb 8 oz (104.6 kg)   SpO2 100%   BMI 41.49 kg/m    Physical Exam Constitutional:      General: She is not in acute distress.    Appearance: She is well-developed. She is not diaphoretic.  HENT:     Right Ear: External ear normal.     Left Ear: External ear normal.     Nose: Nose normal.  Eyes:     Conjunctiva/sclera: Conjunctivae normal.  Neck:     Thyroid: No thyroid mass, thyromegaly or thyroid tenderness.  Cardiovascular:     Rate and Rhythm: Normal rate and regular rhythm.     Heart sounds: No murmur heard.   Pulmonary:     Effort:  Pulmonary effort is normal. No respiratory distress.     Breath sounds: Normal breath sounds.  Musculoskeletal:     Cervical back: Neck supple.  Skin:    General: Skin is warm and dry.     Capillary Refill: Capillary refill takes less than 2 seconds.  Neurological:     Mental Status: She is alert. Mental status is at baseline.  Psychiatric:        Mood and Affect: Mood normal.        Behavior: Behavior normal.    Depression screen [facility] 2/9 05/25/2020  Decreased Interest 2  Down, Depressed, Hopeless 2  PHQ - 2 Score 4  Altered sleeping 3  Tired, decreased energy 3  Change in appetite 1  Feeling bad or failure about yourself  1  Trouble concentrating 2  Moving slowly or fidgety/restless 1  Suicidal thoughts 0  PHQ-9 Score 15  Difficult doing work/chores Somewhat difficult    GAD 7 : Generalized Anxiety Score 05/25/2020  Nervous, Anxious, on Edge 3  Control/stop worrying 2  Worry too much - different things 2  Trouble relaxing 3  Restless 1  Easily annoyed or irritable 2  Afraid -  awful might happen 2  Total GAD 7 Score 15  Anxiety Difficulty Somewhat difficult             Assessment & Plan:   Problem List Items Addressed This Visit      Other   Generalized anxiety disorder    Restart medication. Will use zoloft as first agent, as she previously had worsening anxiety on wellbutrin and wonder if one agent will be enough. Cont home meditation practice.       Relevant Medications   LORazepam (ATIVAN) 0.5 MG tablet   sertraline (ZOLOFT) 100 MG tablet   traZODone (DESYREL) 50 MG tablet   Vitamin D deficiency   Relevant Orders   Vitamin D, 25-hydroxy   Major depressive disorder, recurrent (HCC)    Pt with worsening symptoms after being off medication for a few months. Discussed trial of zoloft alone (was previously on Wellbutrin as well) to see if one medication will treat symptoms. Start zoloft 50 mg > 100 mg after a few weeks as this was her previous dose.  Return in 5-6 weeks      Relevant Medications   LORazepam (ATIVAN) 0.5 MG tablet   sertraline (ZOLOFT) 100 MG tablet   traZODone (DESYREL) 50 MG tablet   Insomnia - Primary    Trial of trazodone to see if this improves sleep and potentially fatigue.       Relevant Medications   traZODone (DESYREL) 50 MG tablet   Other fatigue    Suspect this is multifactorial - worsening depression/anxiety as well as insomnia. Will check labs to rule out other causes. Treat mood and insomnia. Also will consider sleep apnea evaluation if not improving.       Relevant Orders   Comprehensive metabolic panel   CBC   TSH   Ferritin       Return in about 6 weeks (around 07/06/2020).  Lynnda Child, MD  This visit occurred during the SARS-CoV-2 public health emergency.  Safety protocols were in place, including screening questions prior to the visit, additional usage of staff PPE, and extensive cleaning of exam room while observing appropriate contact time as indicated for disinfecting solutions.

## 2020-05-25 NOTE — Assessment & Plan Note (Signed)
Restart medication. Will use zoloft as first agent, as she previously had worsening anxiety on wellbutrin and wonder if one agent will be enough. Cont home meditation practice.

## 2020-05-25 NOTE — Assessment & Plan Note (Signed)
Trial of trazodone to see if this improves sleep and potentially fatigue.

## 2020-05-25 NOTE — Assessment & Plan Note (Signed)
Pt with worsening symptoms after being off medication for a few months. Discussed trial of zoloft alone (was previously on Wellbutrin as well) to see if one medication will treat symptoms. Start zoloft 50 mg > 100 mg after a few weeks as this was her previous dose. Return in 5-6 weeks

## 2020-05-26 ENCOUNTER — Other Ambulatory Visit: Payer: Self-pay | Admitting: Family Medicine

## 2020-05-26 DIAGNOSIS — J3089 Other allergic rhinitis: Secondary | ICD-10-CM | POA: Diagnosis not present

## 2020-05-26 DIAGNOSIS — J301 Allergic rhinitis due to pollen: Secondary | ICD-10-CM | POA: Diagnosis not present

## 2020-05-26 DIAGNOSIS — E559 Vitamin D deficiency, unspecified: Secondary | ICD-10-CM

## 2020-05-26 DIAGNOSIS — J3081 Allergic rhinitis due to animal (cat) (dog) hair and dander: Secondary | ICD-10-CM | POA: Diagnosis not present

## 2020-05-26 LAB — COMPREHENSIVE METABOLIC PANEL
ALT: 11 U/L (ref 0–35)
AST: 17 U/L (ref 0–37)
Albumin: 4.2 g/dL (ref 3.5–5.2)
Alkaline Phosphatase: 74 U/L (ref 39–117)
BUN: 6 mg/dL (ref 6–23)
CO2: 30 mEq/L (ref 19–32)
Calcium: 9.4 mg/dL (ref 8.4–10.5)
Chloride: 101 mEq/L (ref 96–112)
Creatinine, Ser: 0.76 mg/dL (ref 0.40–1.20)
GFR: 100.97 mL/min (ref >60.00)
Glucose, Bld: 90 mg/dL (ref 70–99)
Potassium: 3.8 mEq/L (ref 3.5–5.1)
Sodium: 138 mEq/L (ref 135–145)
Total Bilirubin: 0.6 mg/dL (ref 0.2–1.2)
Total Protein: 7.1 g/dL (ref 6.0–8.3)

## 2020-05-26 LAB — CBC
HCT: 38.6 % (ref 36.0–46.0)
Hemoglobin: 13.4 g/dL (ref 12.0–15.0)
MCHC: 34.8 g/dL (ref 30.0–36.0)
MCV: 87.1 fl (ref 78.0–100.0)
Platelets: 247 10*3/uL (ref 150.0–400.0)
RBC: 4.43 Mil/uL (ref 3.87–5.11)
RDW: 13.3 % (ref 11.5–15.5)
WBC: 6.1 10*3/uL (ref 4.0–10.5)

## 2020-05-26 LAB — FERRITIN: Ferritin: 36 ng/mL (ref 10.0–291.0)

## 2020-05-26 LAB — VITAMIN D 25 HYDROXY (VIT D DEFICIENCY, FRACTURES): VITD: 18.57 ng/mL — ABNORMAL LOW (ref 30.00–100.00)

## 2020-05-26 LAB — TSH: TSH: 1.65 u[IU]/mL (ref 0.35–4.50)

## 2020-05-26 MED ORDER — CHOLECALCIFEROL 1.25 MG (50000 UT) PO TABS: 1.0000 | ORAL_TABLET | ORAL | 1 refills | Status: DC

## 2020-05-26 MED FILL — VIT D2 1.25 MG (50,000 UNIT: 1.25 MG | 28 days supply | Qty: 4 | Fill #0

## 2020-06-02 DIAGNOSIS — J3089 Other allergic rhinitis: Secondary | ICD-10-CM | POA: Diagnosis not present

## 2020-06-02 DIAGNOSIS — J3081 Allergic rhinitis due to animal (cat) (dog) hair and dander: Secondary | ICD-10-CM | POA: Diagnosis not present

## 2020-06-02 DIAGNOSIS — J301 Allergic rhinitis due to pollen: Secondary | ICD-10-CM | POA: Diagnosis not present

## 2020-06-09 DIAGNOSIS — J301 Allergic rhinitis due to pollen: Secondary | ICD-10-CM | POA: Diagnosis not present

## 2020-06-09 DIAGNOSIS — J3081 Allergic rhinitis due to animal (cat) (dog) hair and dander: Secondary | ICD-10-CM | POA: Diagnosis not present

## 2020-06-09 DIAGNOSIS — J3089 Other allergic rhinitis: Secondary | ICD-10-CM | POA: Diagnosis not present

## 2020-06-14 DIAGNOSIS — J301 Allergic rhinitis due to pollen: Secondary | ICD-10-CM | POA: Diagnosis not present

## 2020-06-14 DIAGNOSIS — J3081 Allergic rhinitis due to animal (cat) (dog) hair and dander: Secondary | ICD-10-CM | POA: Diagnosis not present

## 2020-06-14 DIAGNOSIS — J3089 Other allergic rhinitis: Secondary | ICD-10-CM | POA: Diagnosis not present

## 2020-06-22 DIAGNOSIS — J301 Allergic rhinitis due to pollen: Secondary | ICD-10-CM | POA: Diagnosis not present

## 2020-06-22 DIAGNOSIS — J3089 Other allergic rhinitis: Secondary | ICD-10-CM | POA: Diagnosis not present

## 2020-06-22 DIAGNOSIS — J3081 Allergic rhinitis due to animal (cat) (dog) hair and dander: Secondary | ICD-10-CM | POA: Diagnosis not present

## 2020-07-01 ENCOUNTER — Other Ambulatory Visit: Payer: Self-pay

## 2020-07-01 ENCOUNTER — Other Ambulatory Visit: Payer: Self-pay | Admitting: Family Medicine

## 2020-07-01 ENCOUNTER — Ambulatory Visit: Payer: 59 | Admitting: Family Medicine

## 2020-07-01 VITALS — BP 116/74 | HR 96 | Temp 98.3°F | Ht 61.0 in | Wt 226.8 lb

## 2020-07-01 DIAGNOSIS — J3081 Allergic rhinitis due to animal (cat) (dog) hair and dander: Secondary | ICD-10-CM | POA: Diagnosis not present

## 2020-07-01 DIAGNOSIS — J309 Allergic rhinitis, unspecified: Secondary | ICD-10-CM

## 2020-07-01 DIAGNOSIS — F411 Generalized anxiety disorder: Secondary | ICD-10-CM | POA: Diagnosis not present

## 2020-07-01 DIAGNOSIS — F331 Major depressive disorder, recurrent, moderate: Secondary | ICD-10-CM

## 2020-07-01 DIAGNOSIS — G4700 Insomnia, unspecified: Secondary | ICD-10-CM | POA: Diagnosis not present

## 2020-07-01 DIAGNOSIS — H5213 Myopia, bilateral: Secondary | ICD-10-CM | POA: Diagnosis not present

## 2020-07-01 DIAGNOSIS — J4530 Mild persistent asthma, uncomplicated: Secondary | ICD-10-CM | POA: Diagnosis not present

## 2020-07-01 DIAGNOSIS — J3089 Other allergic rhinitis: Secondary | ICD-10-CM | POA: Diagnosis not present

## 2020-07-01 DIAGNOSIS — J301 Allergic rhinitis due to pollen: Secondary | ICD-10-CM | POA: Diagnosis not present

## 2020-07-01 MED ORDER — BUPROPION HCL 75 MG PO TABS: 75.0000 mg | ORAL_TABLET | Freq: Two times a day (BID) | ORAL | 1 refills | Status: DC

## 2020-07-01 MED ORDER — BUDESONIDE-FORMOTEROL FUMARATE 160-4.5 MCG/ACT IN AERO: 2.0000 | INHALATION_SPRAY | Freq: Two times a day (BID) | RESPIRATORY_TRACT | 11 refills | Status: DC

## 2020-07-01 MED ORDER — MONTELUKAST SODIUM 10 MG PO TABS: 10.0000 mg | ORAL_TABLET | Freq: Every day | ORAL | 3 refills | Status: DC

## 2020-07-01 MED FILL — BUPROPION HCL 75 MG TABS: 75 | 30 days supply | Qty: 60 | Fill #0

## 2020-07-01 MED FILL — SYMBICORT 160-4.5 MCG INH: 160-4.5 | 30 days supply | Qty: 10 | Fill #0

## 2020-07-01 MED FILL — MONTELUKAST SOD 10 MG TAB: 10 | 90 days supply | Qty: 90 | Fill #0

## 2020-07-01 NOTE — Assessment & Plan Note (Signed)
Improved on zoloft. Monitor for worsening symptoms on wellbutrin

## 2020-07-01 NOTE — Assessment & Plan Note (Signed)
Improved on zoloft, though she notes this makes her feel flat. She was previously on wellbutrin to help with this and would like to try again. Discussed short acting to see if that will have less impact on anxiety and lower dose. Wellbutrin 75 mg bid. Cont zoloft 100 mg

## 2020-07-01 NOTE — Patient Instructions (Addendum)
Just call if you want to go back to the long acting wellbutrin instead of short acting  Glad the zoloft is working   Return in 1 year if doing well

## 2020-07-01 NOTE — Progress Notes (Signed)
Subjective:     Meredith Campbell is a 34 y.o. female presenting for Follow-up (Anxiety/depression. Pt stated that she feels a lot better)     HPI   #GAD/Depression - feeling better - zoloft makes her feel "flat"  - feels like the wellbutrin helped lift that - her anxiety is better - last day at work was yesterday  #Insomnia - trazodone not working with sleep - sister made her a tea to help with sleep - herbal   Review of Systems  05/25/2020: Clinic - GAD - restart zolof. Insomnia - trial of trazodone. Vit d - deficiency - start treatment  Social History   Tobacco Use  Smoking Status Never Smoker  Smokeless Tobacco Never Used        Objective:    BP Readings from Last 3 Encounters:  07/01/20 116/74  05/25/20 122/80  01/24/19 122/78   Wt Readings from Last 3 Encounters:  07/01/20 226 lb 12.8 oz (102.9 kg)  05/25/20 230 lb 8 oz (104.6 kg)  04/29/19 224 lb (101.6 kg)    BP 116/74 (BP Location: Right Arm, Patient Position: Sitting, Cuff Size: Normal)   Pulse 96   Temp 98.3 F (36.8 C) (Temporal)   Ht 5\' 1"  (1.549 m)   Wt 226 lb 12.8 oz (102.9 kg)   LMP 06/14/2020   SpO2 97%   BMI 42.85 kg/m    Physical Exam Constitutional:      General: She is not in acute distress.    Appearance: She is well-developed. She is not diaphoretic.  HENT:     Right Ear: External ear normal.     Left Ear: External ear normal.     Nose: Nose normal.  Eyes:     Conjunctiva/sclera: Conjunctivae normal.  Cardiovascular:     Rate and Rhythm: Normal rate.  Pulmonary:     Effort: Pulmonary effort is normal.  Musculoskeletal:     Cervical back: Neck supple.  Skin:    General: Skin is warm and dry.     Capillary Refill: Capillary refill takes less than 2 seconds.  Neurological:     Mental Status: She is alert. Mental status is at baseline.  Psychiatric:        Mood and Affect: Mood normal.        Behavior: Behavior normal.    Depression screen [facility] 2/9 07/01/2020  05/25/2020  Decreased Interest 1 2  Down, Depressed, Hopeless 1 2  PHQ - 2 Score 2 4  Altered sleeping 1 3  Tired, decreased energy 1 3  Change in appetite 0 1  Feeling bad or failure about yourself  0 1  Trouble concentrating 0 2  Moving slowly or fidgety/restless 0 1  Suicidal thoughts 0 0  PHQ-9 Score 4 15  Difficult doing work/chores Not difficult at all Somewhat difficult   GAD 7 : Generalized Anxiety Score 07/01/2020 05/25/2020  Nervous, Anxious, on Edge 1 3  Control/stop worrying 1 2  Worry too much - different things 2 2  Trouble relaxing 2 3  Restless 1 1  Easily annoyed or irritable 1 2  Afraid - awful might happen 1 2  Total GAD 7 Score 9 15  Anxiety Difficulty Not difficult at all Somewhat difficult           Assessment & Plan:   Problem List Items Addressed This Visit      Respiratory   Chronic allergic rhinitis   Relevant Medications   montelukast (SINGULAIR) 10 MG tablet  budesonide-formoterol (SYMBICORT) 160-4.5 MCG/ACT inhaler   Mild persistent asthma    Stable. Cont singulair 10 mg, allegra 180 mg, prn albuterol, and symbicort 160-4.5 2 puffs, bid      Relevant Medications   montelukast (SINGULAIR) 10 MG tablet   budesonide-formoterol (SYMBICORT) 160-4.5 MCG/ACT inhaler     Other   Generalized anxiety disorder - Primary    Improved on zoloft. Monitor for worsening symptoms on wellbutrin      Relevant Medications   buPROPion (WELLBUTRIN) 75 MG tablet   Major depressive disorder, recurrent (HCC)    Improved on zoloft, though she notes this makes her feel flat. She was previously on wellbutrin to help with this and would like to try again. Discussed short acting to see if that will have less impact on anxiety and lower dose. Wellbutrin 75 mg bid. Cont zoloft 100 mg      Relevant Medications   buPROPion (WELLBUTRIN) 75 MG tablet   Insomnia    Some improvement with quitting her job. No help from trazodone. Is planning herbal remedies. Return  prn          Return in about 3 months (around 09/29/2020) for if needed .  Lynnda Child, MD  This visit occurred during the SARS-CoV-2 public health emergency.  Safety protocols were in place, including screening questions prior to the visit, additional usage of staff PPE, and extensive cleaning of exam room while observing appropriate contact time as indicated for disinfecting solutions.

## 2020-07-01 NOTE — Assessment & Plan Note (Signed)
Some improvement with quitting her job. No help from trazodone. Is planning herbal remedies. Return prn

## 2020-07-01 NOTE — Assessment & Plan Note (Signed)
Stable. Cont singulair 10 mg, allegra 180 mg, prn albuterol, and symbicort 160-4.5 2 puffs, bid

## 2020-07-13 ENCOUNTER — Ambulatory Visit: Payer: 59 | Attending: Internal Medicine

## 2020-07-13 DIAGNOSIS — Z23 Encounter for immunization: Secondary | ICD-10-CM

## 2020-07-13 NOTE — Progress Notes (Signed)
   Covid-19 Vaccination Clinic  Name:  Meredith Campbell    MRN: [id number] DOB: 12-23-1985  07/13/2020  [name] was observed post Covid-19 immunization for 15 minutes without incident. She was provided with Vaccine Information Sheet and instruction to access the V-Safe system.   [name] was instructed to call 911 with any severe reactions post vaccine: Marland Kitchen Difficulty breathing  . Swelling of face and throat  . A fast heartbeat  . A bad rash all over body  . Dizziness and weakness   Immunizations Administered    Name Date Dose VIS Date Route   Pfizer COVID-19 Vaccine 07/13/2020  2:52 PM 0.3 mL 04/21/2020 Intramuscular   Manufacturer: [employer], [employer]   Lot: [id number]   NDC: [address]-0

## 2020-09-22 IMAGING — DX DG CHEST 2V
2 series · 2 of 2 positions shown · non-contrast
Comparison: Radiographs April 19, 2016.

CLINICAL DATA: Productive cough.

EXAM:
CHEST - 2 VIEW

[chest pa]
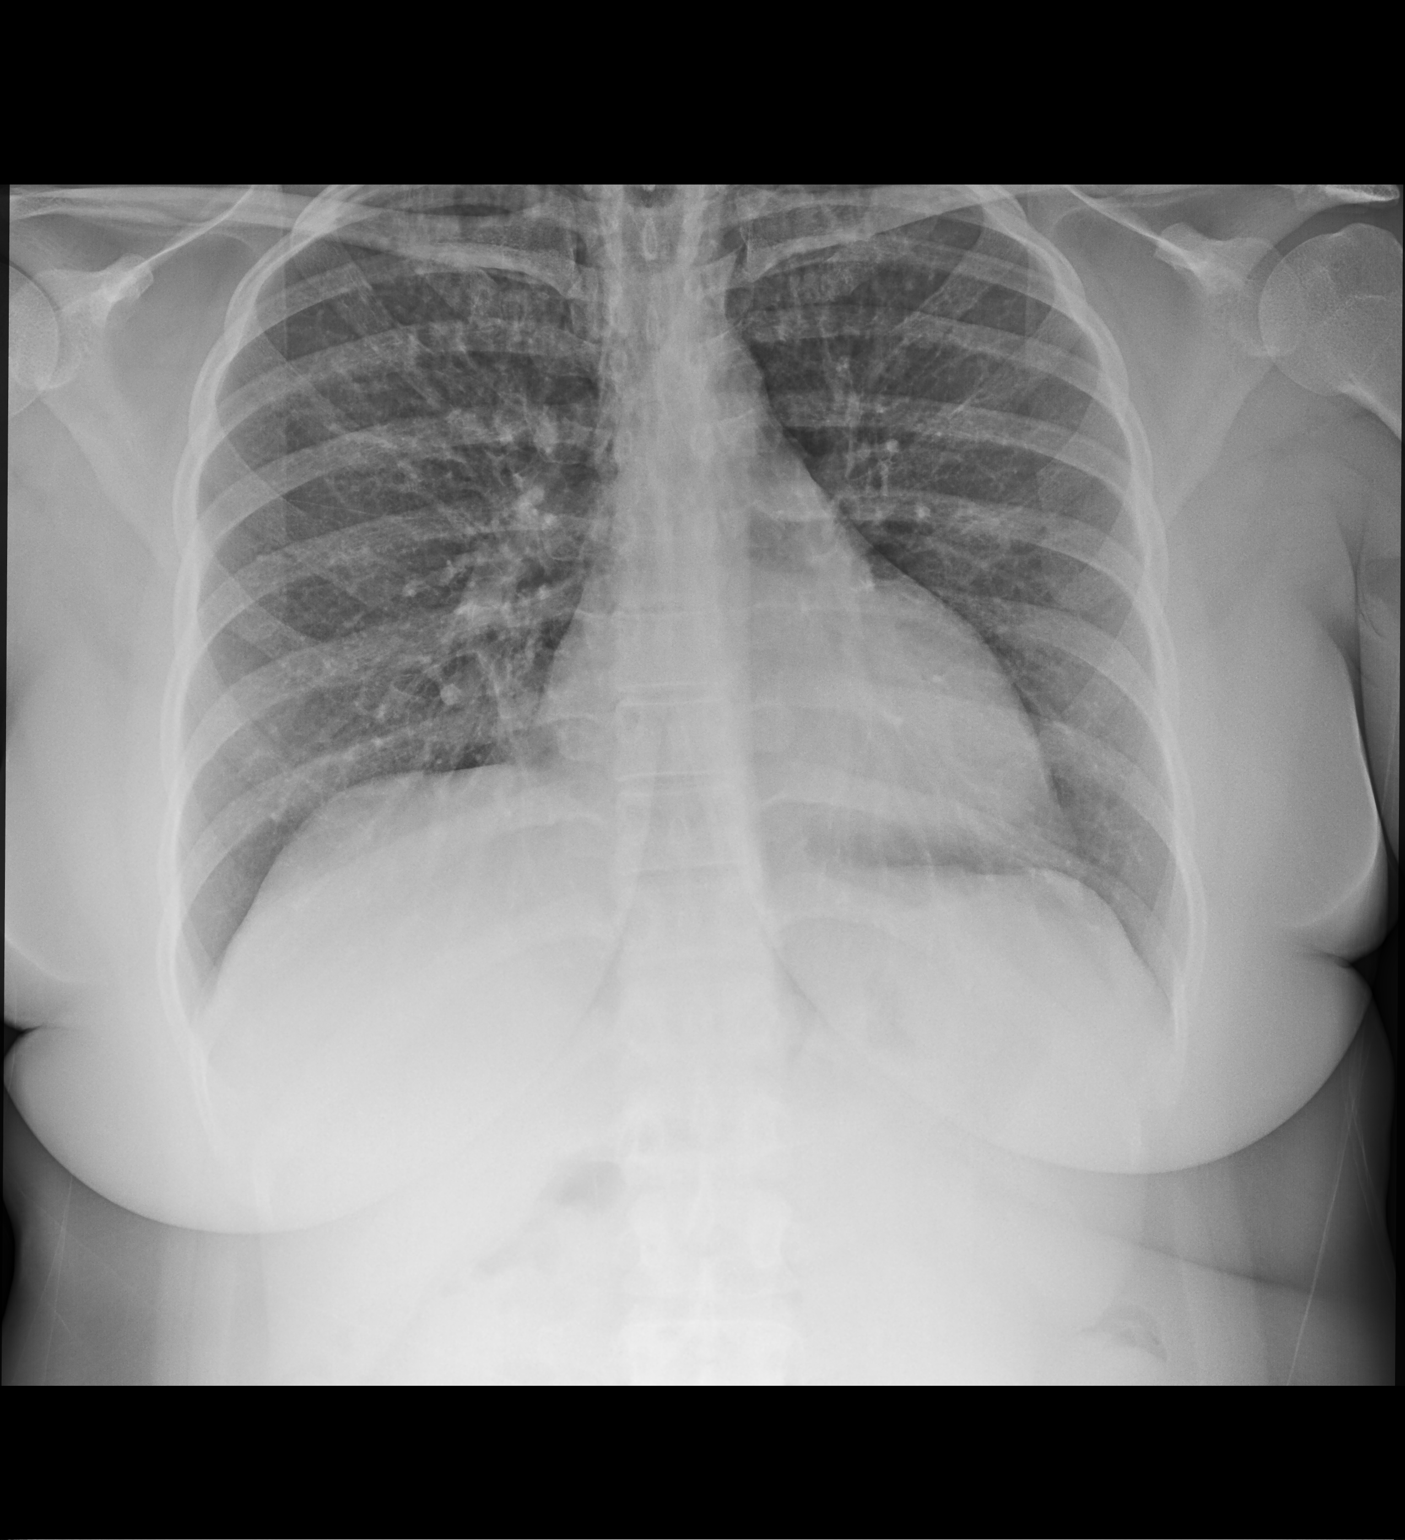

[chest lat]
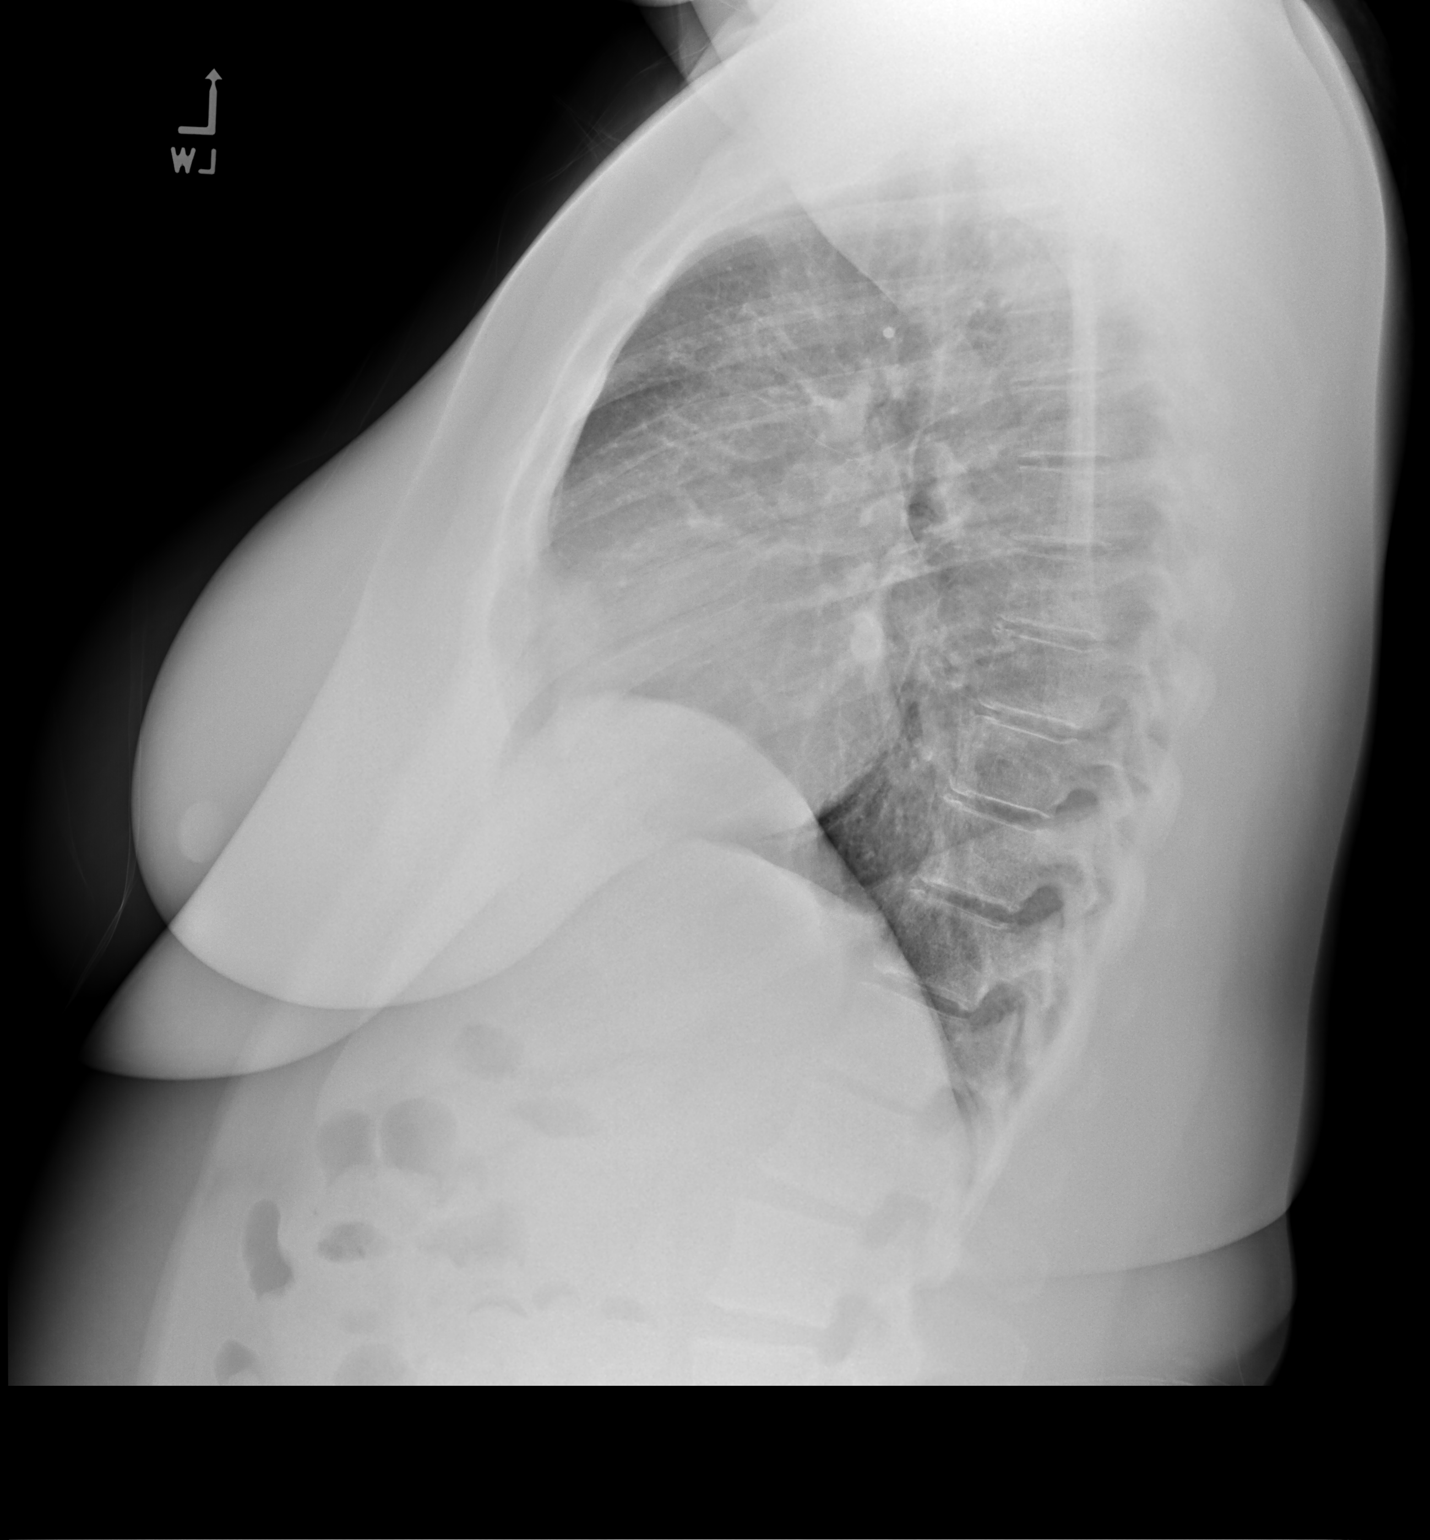

[2 of 2 positions shown; findings below may reference images not displayed]

FINDINGS: The heart size and mediastinal contours are within normal limits.
Both lungs are clear. The visualized skeletal structures are
unremarkable.
IMPRESSION: No active cardiopulmonary disease.

## 2020-12-01 ENCOUNTER — Other Ambulatory Visit: Payer: Self-pay | Admitting: Family Medicine

## 2020-12-01 ENCOUNTER — Other Ambulatory Visit (HOSPITAL_COMMUNITY): Payer: Self-pay

## 2020-12-01 DIAGNOSIS — J4530 Mild persistent asthma, uncomplicated: Secondary | ICD-10-CM

## 2020-12-01 MED ORDER — EPINEPHRINE 0.3 MG/0.3ML IJ SOAJ
INTRAMUSCULAR | 0 refills | Status: DC
  Filled 2020-12-01: qty 2, 30d supply, fill #0

## 2020-12-01 MED FILL — Budesonide-Formoterol Fumarate Dihyd Aerosol 160-4.5 MCG/ACT: RESPIRATORY_TRACT | 30 days supply | Qty: 10.2 | Fill #0 | Status: CN

## 2020-12-01 MED FILL — Sertraline HCl Tab 100 MG: ORAL | 90 days supply | Qty: 90 | Fill #0 | Status: AC

## 2020-12-30 ENCOUNTER — Other Ambulatory Visit (HOSPITAL_COMMUNITY): Payer: Self-pay

## 2020-12-31 ENCOUNTER — Other Ambulatory Visit (HOSPITAL_COMMUNITY): Payer: Self-pay

## 2021-01-17 ENCOUNTER — Other Ambulatory Visit (HOSPITAL_COMMUNITY): Payer: Self-pay

## 2021-05-12 DIAGNOSIS — E538 Deficiency of other specified B group vitamins: Secondary | ICD-10-CM | POA: Insufficient documentation

## 2021-05-24 ENCOUNTER — Ambulatory Visit
Admission: EM | Admit: 2021-05-24 | Discharge: 2021-05-24 | Disposition: A | Discharge status: Home / Self Care | Payer: 59 | Attending: Family Medicine | Admitting: Family Medicine

## 2021-05-24 ENCOUNTER — Encounter: Payer: Self-pay | Admitting: Emergency Medicine

## 2021-05-24 DIAGNOSIS — J45901 Unspecified asthma with (acute) exacerbation: Secondary | ICD-10-CM

## 2021-05-24 DIAGNOSIS — J111 Influenza due to unidentified influenza virus with other respiratory manifestations: Secondary | ICD-10-CM

## 2021-05-24 MED ORDER — ACETAMINOPHEN 325 MG PO TABS
650.0000 mg | ORAL_TABLET | Freq: Once | ORAL | Status: AC
  Administered 2021-05-24: 650 mg via ORAL

## 2021-05-24 MED ORDER — OSELTAMIVIR PHOSPHATE 75 MG PO CAPS: 75.0000 mg | ORAL_CAPSULE | Freq: Two times a day (BID) | ORAL | 0 refills | Status: DC

## 2021-05-24 MED ORDER — PREDNISONE 20 MG PO TABS: 40.0000 mg | ORAL_TABLET | Freq: Every day | ORAL | 0 refills | Status: DC

## 2021-05-24 NOTE — Discharge Instructions (Signed)
Your COVID/Flu results should result within 3-5 days. °Negative results are immediately resulted to Mychart. Positive results will receive a follow-up call from our clinic. If symptoms are present, I recommend home quarantine until results are known.  °Alternate Tylenol and ibuprofen as needed for body aches and fever.  Symptom management per recommendations discussed today.  If any breathing difficulty or chest pain develops go immediately to the closest emergency department for evaluation.  °

## 2021-05-24 NOTE — ED Provider Notes (Signed)
[facility]    CSN: [id number] Arrival date & time: 05/24/21  1251      History   Chief Complaint Chief Complaint  Patient presents with   Flu Like Symptoms     HPI Meredith Campbell is a 35 y.o. female.   HPI Patient presents for evaluation chills, shortness of breath, chest tightness, mild cough, body aches, and febrile on arrival. Onset of symptoms x 1 day. No known sick contacts. History of asthma.  Endorses poor appetite.  No nausea, vomiting, or dizziness. Past Medical History:  Diagnosis Date   Asthma    Chicken pox    Depression    GERD (gastroesophageal reflux disease)    Hay fever    Seasonal allergies     Patient Active Problem List   Diagnosis Date Noted   Vitamin D deficiency 05/25/2020   Major depressive disorder, recurrent (HCC) 05/25/2020   Insomnia 05/25/2020   Other fatigue 05/25/2020   Morbid obesity (HCC) 05/25/2020   Generalized anxiety disorder 01/24/2019   Chronic allergic rhinitis 03/28/2018   Mild persistent asthma 03/28/2018    Past Surgical History:  Procedure Laterality Date   EAR CYST EXCISION      OB History   No obstetric history on file.      Home Medications    Prior to Admission medications   Medication Sig Start Date End Date Taking? Authorizing Provider  albuterol (PROVENTIL) (2.5 MG/3ML) 0.083% nebulizer solution    Yes [provider]  albuterol (VENTOLIN HFA) 108 (90 Base) MCG/ACT inhaler Inhale 2 puffs into the lungs every 6 (six) hours as needed for wheezing or shortness of breath. 04/29/19  Yes Guse, Janna Arch, FNP  budesonide-formoterol (SYMBICORT) 160-4.5 MCG/ACT inhaler INHALE 2 PUFFS INTO THE LUNGS TWICE DAILY. 07/01/20 07/01/21 Yes Lynnda Child, MD  cetirizine (ZYRTEC) 10 MG tablet Take 10 mg by mouth daily. 07/03/16  Yes [provider]  EPINEPHrine 0.3 mg/0.3 mL IJ SOAJ injection Inject into the muscle as directed. 01/06/20  Yes [provider]  fexofenadine  (ALLEGRA) 180 MG tablet Take 180 mg by mouth daily. 07/03/16  Yes [provider]  montelukast (SINGULAIR) 10 MG tablet TAKE 1 TABLET (10 MG TOTAL) BY MOUTH AT BEDTIME. 07/01/20 07/01/21 Yes Lynnda Child, MD  oseltamivir (TAMIFLU) 75 MG capsule Take 1 capsule (75 mg total) by mouth 2 (two) times daily. 05/24/21  Yes Bing Neighbors, FNP  predniSONE (DELTASONE) 20 MG tablet Take 2 tablets (40 mg total) by mouth daily with breakfast. 05/24/21  Yes Bing Neighbors, FNP  sertraline (ZOLOFT) 100 MG tablet TAKE 1 TABLET BY MOUTH EVERY MORNING 05/25/20 05/25/21 Yes Lynnda Child, MD  buPROPion (WELLBUTRIN) 75 MG tablet TAKE 1 TABLET BY MOUTH TWICE DAILY. 07/01/20 07/01/21  Lynnda Child, MD  Cholecalciferol 1.25 MG (50000 UT) TABS Take 1 tablet by mouth once a week. 05/26/20   Lynnda Child, MD  EPINEPHrine (EPIPEN 2-PAK) 0.3 mg/0.3 mL IJ SOAJ injection Use as directed for systemic reactions 01/06/20     FERROUS GLUCONATE PO Take 10 mg by mouth in the morning and at bedtime.    [provider]  LORazepam (ATIVAN) 0.5 MG tablet Take 0.25-0.5 mg by mouth every 6 (six) hours as needed. 01/03/20   [provider]  traZODone (DESYREL) 50 MG tablet Take 0.5-1 tablets (25-50 mg total) by mouth at bedtime as needed for sleep. 05/25/20 07/01/20  Lynnda Child, MD    Family History Family History  Problem Relation Age of Onset   Diabetes Mother    Hypertension Mother    Hyperlipidemia Mother    Kidney disease Paternal Uncle    Diabetes Maternal Grandmother    Hypertension Maternal Grandmother    Stroke Paternal Grandmother    Stroke Maternal Grandfather     Social History Social History   Tobacco Use   Smoking status: Never   Smokeless tobacco: Never  Vaping Use   Vaping Use: Never used  Substance Use Topics   Alcohol use: Yes    Comment: less than once a year   Drug use: No     Allergies   Almond (diagnostic), Apple, Corn-containing products, Pear,  Pearson sakrin [saccharin ammonium], Plum pulp, Pork-derived products, Rice, and Wheat bran   Review of Systems Review of Systems Pertinent negatives listed in HPI   Physical Exam Triage Vital Signs ED Triage Vitals [05/24/21 1443]  Enc Vitals Group     BP 137/85     Pulse Rate (!) 120     Resp 18     Temp (!) 100.4 F (38 C)     Temp Source Oral     SpO2 97 %     Weight      Height      Head Circumference      Peak Flow      Pain Score      Pain Loc      Pain Edu?      Excl. in GC?    No data found.  Updated Vital Signs BP 137/85 (BP Location: Left Arm)   Pulse (!) 120   Temp (!) 100.4 F (38 C) (Oral)   Resp 18   LMP 05/11/2021   SpO2 97%   Visual Acuity Right Eye Distance:   Left Eye Distance:   Bilateral Distance:    Right Eye Near:   Left Eye Near:    Bilateral Near:     Physical Exam General appearance: alert, Ill-appearing, no distress Head: Normocephalic, without obvious abnormality, atraumatic ENT: External ears normal, nares  with mucosal edema, congestion, oropharynx w/o exudate Respiratory: Respirations even , unlabored, coarse lung sound, expiratory wheeze Heart: rate and rhythm normal. No gallop or murmurs noted on exam  Abdomen: BS +, no distention, no rebound tenderness, or no mass Extremities: No gross deformities Skin: Skin color, texture, turgor normal. No rashes seen  Psych: Appropriate mood and affect. Neurologic: No obvious neurological focal abnormalities UC Treatments / Results  Labs (all labs ordered are listed, but only abnormal results are displayed) Labs Reviewed  COVID-19, FLU A+B NAA   Narrative:    Performed at:  [address] [place], [place]  [id number] Lab Director: Jolene Schimke MD, Phone:  [phone]    EKG   Radiology No results found.  Procedures Procedures (including critical care time)  Medications Ordered in UC Medications  acetaminophen (TYLENOL) tablet 650 mg (650 mg  Oral Given 05/24/21 1503)    Initial Impression / Assessment and Plan / UC Course  I have reviewed the triage vital signs and the nursing notes.  Pertinent labs & imaging results that were available during my care of the patient were reviewed by me and considered in my medical decision making (see chart for details).    Influenza like illness Will start Tamiflu twice daily for 5 days Prednisone 40 mg once daily for bronchitis-like symptoms. Strict ER precautions given if symptoms worsen or become severe and do not respond to  treatment. Final Clinical Impressions(s) / UC Diagnoses   Final diagnoses:  Influenza-like illness     Discharge Instructions       Your COVID/Flu results should result within 3-5 days. Negative results are immediately resulted to Mychart. Positive results will receive a follow-up call from our clinic. If symptoms are present, I recommend home quarantine until results are known.  Alternate Tylenol and ibuprofen as needed for body aches and fever.  Symptom management per recommendations discussed today.  If any breathing difficulty or chest pain develops go immediately to the closest emergency department for evaluation.      ED Prescriptions     Medication Sig Dispense Auth. Provider   oseltamivir (TAMIFLU) 75 MG capsule Take 1 capsule (75 mg total) by mouth 2 (two) times daily. 10 capsule Bing Neighbors, FNP   predniSONE (DELTASONE) 20 MG tablet Take 2 tablets (40 mg total) by mouth daily with breakfast. 10 tablet Bing Neighbors, FNP      PDMP not reviewed this encounter.   Bing Neighbors, FNP 05/29/21 1020

## 2021-05-24 NOTE — ED Triage Notes (Signed)
Pt presents with flu-like sxs that started last night.

## 2021-05-26 LAB — COVID-19, FLU A+B NAA
Influenza A, NAA: NOT DETECTED
Influenza B, NAA: NOT DETECTED
SARS-CoV-2, NAA: NOT DETECTED

## 2021-07-05 ENCOUNTER — Encounter: Payer: 59 | Admitting: Family Medicine

## 2021-07-08 ENCOUNTER — Ambulatory Visit
Admission: EM | Admit: 2021-07-08 | Discharge: 2021-07-08 | Disposition: A | Discharge status: Home / Self Care | Payer: Managed Care, Other (non HMO) | Attending: Physician Assistant | Admitting: Physician Assistant

## 2021-07-08 ENCOUNTER — Other Ambulatory Visit: Payer: Self-pay

## 2021-07-08 ENCOUNTER — Ambulatory Visit: Admission: EM | Admit: 2021-07-08 | Payer: Self-pay

## 2021-07-08 ENCOUNTER — Encounter: Payer: Self-pay | Admitting: Emergency Medicine

## 2021-07-08 DIAGNOSIS — R051 Acute cough: Secondary | ICD-10-CM | POA: Diagnosis not present

## 2021-07-08 DIAGNOSIS — U071 COVID-19: Secondary | ICD-10-CM | POA: Insufficient documentation

## 2021-07-08 DIAGNOSIS — R509 Fever, unspecified: Secondary | ICD-10-CM | POA: Insufficient documentation

## 2021-07-08 DIAGNOSIS — J45901 Unspecified asthma with (acute) exacerbation: Secondary | ICD-10-CM | POA: Insufficient documentation

## 2021-07-08 LAB — RESP PANEL BY RT-PCR (FLU A&B, COVID) ARPGX2
Influenza A by PCR: NEGATIVE
Influenza B by PCR: NEGATIVE
SARS Coronavirus 2 by RT PCR: POSITIVE — AB

## 2021-07-08 LAB — GROUP A STREP BY PCR: Group A Strep by PCR: NOT DETECTED

## 2021-07-08 MED ORDER — PREDNISONE 20 MG PO TABS: 40.0000 mg | ORAL_TABLET | Freq: Every day | ORAL | 0 refills | Status: AC

## 2021-07-08 MED ORDER — MOLNUPIRAVIR 200 MG PO CAPS: 4.0000 | ORAL_CAPSULE | Freq: Two times a day (BID) | ORAL | 0 refills | Status: AC

## 2021-07-08 NOTE — ED Provider Notes (Signed)
[facility]    CSN: [id number] Arrival date & time: 07/08/21  1330      History   Chief Complaint Chief Complaint  Patient presents with   Sore Throat   Shortness of Breath   Fever   Cough    HPI Leialoha Hanna is a 36 y.o. female presenting for 2-day history of sore throat, cough, congestion, chest tightness and shortness of breath.  Patient reports that she developed a fever today up to 101 degrees.  Does not report any nausea/vomiting or diarrhea.  No sick contacts or known exposure to COVID or flu.  Patient has history of asthma.  Has been using her Symbicort inhaler as directed.  Has not been using her albuterol any more frequently than normal.  Has not been taking any medication for symptoms.  No other complaints.  HPI  Past Medical History:  Diagnosis Date   Asthma    Chicken pox    Depression    GERD (gastroesophageal reflux disease)    Hay fever    Seasonal allergies     Patient Active Problem List   Diagnosis Date Noted   Vitamin D deficiency 05/25/2020   Major depressive disorder, recurrent (HCC) 05/25/2020   Insomnia 05/25/2020   Other fatigue 05/25/2020   Morbid obesity (HCC) 05/25/2020   Generalized anxiety disorder 01/24/2019   Chronic allergic rhinitis 03/28/2018   Mild persistent asthma 03/28/2018    Past Surgical History:  Procedure Laterality Date   EAR CYST EXCISION      OB History   No obstetric history on file.      Home Medications    Prior to Admission medications   Medication Sig Start Date End Date Taking? Authorizing Provider  albuterol (VENTOLIN HFA) 108 (90 Base) MCG/ACT inhaler Inhale 2 puffs into the lungs every 6 (six) hours as needed for wheezing or shortness of breath. 04/29/19  Yes Guse, Janna Arch, FNP  budesonide-formoterol (SYMBICORT) 160-4.5 MCG/ACT inhaler INHALE 2 PUFFS INTO THE LUNGS TWICE DAILY. 07/01/20 07/08/21 Yes Lynnda Child, MD  cetirizine (ZYRTEC) 10 MG tablet Take 10 mg by mouth daily.    Yes [provider]  fexofenadine (ALLEGRA) 180 MG tablet Take 180 mg by mouth daily. 07/03/16  Yes [provider]  molnupiravir EUA (LAGEVRIO) 200 MG CAPS capsule Take 4 capsules (800 mg total) by mouth 2 (two) times daily for 5 days. 07/08/21 07/13/21 Yes Eusebio Friendly B, PA-C  montelukast (SINGULAIR) 10 MG tablet TAKE 1 TABLET (10 MG TOTAL) BY MOUTH AT BEDTIME. 07/01/20 07/08/21 Yes Lynnda Child, MD  predniSONE (DELTASONE) 20 MG tablet Take 2 tablets (40 mg total) by mouth daily for 5 days. 07/08/21 07/13/21 Yes Eusebio Friendly B, PA-C  albuterol (PROVENTIL) (2.5 MG/3ML) 0.083% nebulizer solution     [provider]  buPROPion (WELLBUTRIN) 75 MG tablet TAKE 1 TABLET BY MOUTH TWICE DAILY. 07/01/20 07/01/21  Lynnda Child, MD  cetirizine (ZYRTEC) 10 MG tablet Take 10 mg by mouth daily. 07/03/16   [provider]  Cholecalciferol 1.25 MG (50000 UT) TABS Take 1 tablet by mouth once a week. 05/26/20   Lynnda Child, MD  EPINEPHrine (EPIPEN 2-PAK) 0.3 mg/0.3 mL IJ SOAJ injection Use as directed for systemic reactions 01/06/20     EPINEPHrine 0.3 mg/0.3 mL IJ SOAJ injection Inject into the muscle as directed. 01/06/20   [provider]  FERROUS GLUCONATE PO Take 10 mg by mouth in the morning and at bedtime.    [provider]  LORazepam (ATIVAN) 0.5 MG tablet Take 0.25-0.5 mg by mouth every 6 (six) hours as needed. 01/03/20   [provider]  oseltamivir (TAMIFLU) 75 MG capsule Take 1 capsule (75 mg total) by mouth 2 (two) times daily. 05/24/21   Bing Neighbors, FNP  sertraline (ZOLOFT) 100 MG tablet TAKE 1 TABLET BY MOUTH EVERY MORNING 05/25/20 05/25/21  Lynnda Child, MD  traZODone (DESYREL) 50 MG tablet Take 0.5-1 tablets (25-50 mg total) by mouth at bedtime as needed for sleep. 05/25/20 07/01/20  Lynnda Child, MD    Family History Family History  Problem Relation Age of Onset   Diabetes Mother    Hypertension Mother    Hyperlipidemia  Mother    Kidney disease Paternal Uncle    Diabetes Maternal Grandmother    Hypertension Maternal Grandmother    Stroke Paternal Grandmother    Stroke Maternal Grandfather     Social History Social History   Tobacco Use   Smoking status: Never   Smokeless tobacco: Never  Vaping Use   Vaping Use: Never used  Substance Use Topics   Alcohol use: Yes    Comment: less than once a year   Drug use: No     Allergies   Almond (diagnostic), Apple, Corn-containing products, Pear, Pearson sakrin [saccharin ammonium], Plum pulp, Pork-derived products, Rice, and Wheat bran   Review of Systems Review of Systems  Constitutional:  Positive for fatigue and fever. Negative for chills and diaphoresis.  HENT:  Positive for congestion, rhinorrhea and sore throat. Negative for ear pain, sinus pressure and sinus pain.   Respiratory:  Positive for cough, chest tightness and shortness of breath.   Cardiovascular:  Negative for chest pain.  Gastrointestinal:  Negative for abdominal pain, nausea and vomiting.  Musculoskeletal:  Negative for arthralgias and myalgias.  Skin:  Negative for rash.  Neurological:  Negative for weakness and headaches.  Hematological:  Negative for adenopathy.    Physical Exam Triage Vital Signs ED Triage Vitals  Enc Vitals Group     BP 07/08/21 1341 (!) 145/95     Pulse Rate 07/08/21 1341 (!) 107     Resp 07/08/21 1341 14     Temp 07/08/21 1341 99.9 F (37.7 C)     Temp Source 07/08/21 1341 Oral     SpO2 07/08/21 1341 100 %     Weight 07/08/21 1337 230 lb (104.3 kg)     Height 07/08/21 1337 5\' 1"  (1.549 m)     Head Circumference --      Peak Flow --      Pain Score 07/08/21 1337 5     Pain Loc --      Pain Edu? --      Excl. in GC? --    No data found.  Updated Vital Signs BP (!) 145/95 (BP Location: Left Arm)    Pulse (!) 107    Temp 99.9 F (37.7 C) (Oral)    Resp 14    Ht 5\' 1"  (1.549 m)    Wt 230 lb (104.3 kg)    LMP 06/24/2021 (Approximate)    SpO2  100%    BMI 43.46 kg/m     Physical Exam Vitals and nursing note reviewed.  Constitutional:      General: She is not in acute distress.    Appearance: Normal appearance. She is ill-appearing. She is not toxic-appearing.  HENT:     Head: Normocephalic and atraumatic.     Nose:  Congestion and rhinorrhea present.     Mouth/Throat:     Mouth: Mucous membranes are moist.     Pharynx: Oropharynx is clear. Posterior oropharyngeal erythema present.  Eyes:     General: No scleral icterus.       Right eye: No discharge.        Left eye: No discharge.     Conjunctiva/sclera: Conjunctivae normal.  Cardiovascular:     Rate and Rhythm: Regular rhythm. Tachycardia present.     Heart sounds: Normal heart sounds.  Pulmonary:     Effort: Pulmonary effort is normal. No respiratory distress.     Breath sounds: Normal breath sounds.  Musculoskeletal:     Cervical back: Neck supple.  Skin:    General: Skin is dry.  Neurological:     General: No focal deficit present.     Mental Status: She is alert. Mental status is at baseline.     Motor: No weakness.     Gait: Gait normal.  Psychiatric:        Mood and Affect: Mood normal.        Behavior: Behavior normal.        Thought Content: Thought content normal.     UC Treatments / Results  Labs (all labs ordered are listed, but only abnormal results are displayed) Labs Reviewed  RESP PANEL BY RT-PCR (FLU A&B, COVID) ARPGX2 - Abnormal; Notable for the following components:      Result Value   SARS Coronavirus 2 by RT PCR POSITIVE (*)    All other components within normal limits  GROUP A STREP BY PCR    EKG   Radiology No results found.  Procedures Procedures (including critical care time)  Medications Ordered in UC Medications - No data to display  Initial Impression / Assessment and Plan / UC Course  I have reviewed the triage vital signs and the nursing notes.  Pertinent labs & imaging results that were available during my  care of the patient were reviewed by me and considered in my medical decision making (see chart for details).  36 year old female with history of asthma presenting for 2-day history of cough, congestion, sore throat, shortness of breath and chest tightness.  Onset of fever today.  Patient is tachycardic.  Temp is 99.9 degrees.  O2 is 100%.  She is in no respiratory distress.  Patient ill-appearing but nontoxic.  On exam she does have nasal congestion and clear rhinorrhea.  Mild posterior pharyngeal erythema.  Chest clear to auscultation.  Respiratory panel obtained as well as strep PCR.  Respiratory panel positive for COVID-19.  Discussed result with patient.  Reviewed current CDC guidelines, isolation protocol and ED precautions with patient.  Given her history of asthma, she has a risk factor so we will treat her with Molnupiravir and advised her to use birth control 3 months after taking.  She says she is not sexually active.  Encouraged increase rest and fluids.  Offered cough medicine but she declined.  I did send prednisone to help with the asthma exacerbation and advised her to use her inhalers as directed and may be her albuterol more frequently.  Work note given.   Final Clinical Impressions(s) / UC Diagnoses   Final diagnoses:  COVID-19  Acute cough  Fever, unspecified  Asthma with acute exacerbation, unspecified asthma severity, unspecified whether persistent     Discharge Instructions      + COVID  Use birth control 3 months after Molnupiravir.  You have  received COVID testing today either for positive exposure, concerning symptoms that could be related to COVID infection, screening purposes, or re-testing after confirmed positive.  Your test obtained today checks for active viral infection in the last 1-2 weeks. If your test is negative now, you can still test positive later. So, if you do develop symptoms you should either get re-tested and/or isolate x 5 days and then strict  mask use x 5 days (unvaccinated) or mask use x 10 days (vaccinated). Please follow CDC guidelines.  While Rapid antigen tests come back in 15-20 minutes, send out PCR/molecular test results typically come back within 1-3 days. In the mean time, if you are symptomatic, assume this could be a positive test and treat/monitor yourself as if you do have COVID.   We will call with test results if positive. Please download the MyChart app and set up a profile to access test results.   If symptomatic, go home and rest. Push fluids. Take Tylenol as needed for discomfort. Gargle warm salt water. Throat lozenges. Take Mucinex DM or Robitussin for cough. Humidifier in bedroom to ease coughing. Warm showers. Also review the COVID handout for more information.  COVID-19 INFECTION: The incubation period of COVID-19 is approximately 14 days after exposure, with most symptoms developing in roughly 4-5 days. Symptoms may range in severity from mild to critically severe. Roughly 80% of those infected will have mild symptoms. People of any age may become infected with COVID-19 and have the ability to transmit the virus. The most common symptoms include: fever, fatigue, cough, body aches, headaches, sore throat, nasal congestion, shortness of breath, nausea, vomiting, diarrhea, changes in smell and/or taste.    COURSE OF ILLNESS Some patients may begin with mild disease which can progress quickly into critical symptoms. If your symptoms are worsening please call ahead to the Emergency Department and proceed there for further treatment. Recovery time appears to be roughly 1-2 weeks for mild symptoms and 3-6 weeks for severe disease.   GO IMMEDIATELY TO ER FOR FEVER YOU ARE UNABLE TO GET DOWN WITH TYLENOL, BREATHING PROBLEMS, CHEST PAIN, FATIGUE, LETHARGY, INABILITY TO EAT OR DRINK, ETC  QUARANTINE AND ISOLATION: To help decrease the spread of COVID-19 please remain isolated if you have COVID infection or are highly  suspected to have COVID infection. This means -stay home and isolate to one room in the home if you live with others. Do not share a bed or bathroom with others while ill, sanitize and wipe down all countertops and keep common areas clean and disinfected. Stay home for 5 days. If you have no symptoms or your symptoms are resolving after 5 days, you can leave your house. Continue to wear a mask around others for 5 additional days. If you have been in close contact (within 6 feet) of someone diagnosed with COVID 19, you are advised to quarantine in your home for 14 days as symptoms can develop anywhere from 2-14 days after exposure to the virus. If you develop symptoms, you  must isolate.  Most current guidelines for COVID after exposure -unvaccinated: isolate 5 days and strict mask use x 5 days. Test on day 5 is possible -vaccinated: wear mask x 10 days if symptoms do not develop -You do not necessarily need to be tested for COVID if you have + exposure and  develop symptoms. Just isolate at home x10 days from symptom onset During this global pandemic, CDC advises to practice social distancing, try to stay at least 486ft  away from others at all times. Wear a face covering. Wash and sanitize your hands regularly and avoid going anywhere that is not necessary.  KEEP IN MIND THAT THE COVID TEST IS NOT 100% ACCURATE AND YOU SHOULD STILL DO EVERYTHING TO PREVENT POTENTIAL SPREAD OF VIRUS TO OTHERS (WEAR MASK, WEAR GLOVES, WASH HANDS AND SANITIZE REGULARLY). IF INITIAL TEST IS NEGATIVE, THIS MAY NOT MEAN YOU ARE DEFINITELY NEGATIVE. MOST ACCURATE TESTING IS DONE 5-7 DAYS AFTER EXPOSURE.   It is not advised by CDC to get re-tested after receiving a positive COVID test since you can still test positive for weeks to months after you have already cleared the virus.   *If you have not been vaccinated for COVID, I strongly suggest you consider getting vaccinated as long as there are no contraindications.       ED  Prescriptions     Medication Sig Dispense Auth. Provider   predniSONE (DELTASONE) 20 MG tablet Take 2 tablets (40 mg total) by mouth daily for 5 days. 10 tablet Eusebio FriendlyEaves, Dameka Younker B, PA-C   molnupiravir EUA (LAGEVRIO) 200 MG CAPS capsule Take 4 capsules (800 mg total) by mouth 2 (two) times daily for 5 days. 40 capsule Shirlee LatchEaves, Camilah Spillman B, PA-C      PDMP not reviewed this encounter.   Shirlee Latchaves, Cieanna Stormes B, PA-C 07/08/21 1446

## 2021-07-08 NOTE — ED Triage Notes (Signed)
Patient c/o sore throat, cough, fevers, chest tightness, and SOB that started 3 days ago.

## 2021-07-08 NOTE — Discharge Instructions (Addendum)
+ COVID  Use birth control 3 months after Molnupiravir.  You have received COVID testing today either for positive exposure, concerning symptoms that could be related to COVID infection, screening purposes, or re-testing after confirmed positive.  Your test obtained today checks for active viral infection in the last 1-2 weeks. If your test is negative now, you can still test positive later. So, if you do develop symptoms you should either get re-tested and/or isolate x 5 days and then strict mask use x 5 days (unvaccinated) or mask use x 10 days (vaccinated). Please follow CDC guidelines.  While Rapid antigen tests come back in 15-20 minutes, send out PCR/molecular test results typically come back within 1-3 days. In the mean time, if you are symptomatic, assume this could be a positive test and treat/monitor yourself as if you do have COVID.   We will call with test results if positive. Please download the MyChart app and set up a profile to access test results.   If symptomatic, go home and rest. Push fluids. Take Tylenol as needed for discomfort. Gargle warm salt water. Throat lozenges. Take Mucinex DM or Robitussin for cough. Humidifier in bedroom to ease coughing. Warm showers. Also review the COVID handout for more information.  COVID-19 INFECTION: The incubation period of COVID-19 is approximately 14 days after exposure, with most symptoms developing in roughly 4-5 days. Symptoms may range in severity from mild to critically severe. Roughly 80% of those infected will have mild symptoms. People of any age may become infected with COVID-19 and have the ability to transmit the virus. The most common symptoms include: fever, fatigue, cough, body aches, headaches, sore throat, nasal congestion, shortness of breath, nausea, vomiting, diarrhea, changes in smell and/or taste.    COURSE OF ILLNESS Some patients may begin with mild disease which can progress quickly into critical symptoms. If your  symptoms are worsening please call ahead to the Emergency Department and proceed there for further treatment. Recovery time appears to be roughly 1-2 weeks for mild symptoms and 3-6 weeks for severe disease.   GO IMMEDIATELY TO ER FOR FEVER YOU ARE UNABLE TO GET DOWN WITH TYLENOL, BREATHING PROBLEMS, CHEST PAIN, FATIGUE, LETHARGY, INABILITY TO EAT OR DRINK, ETC  QUARANTINE AND ISOLATION: To help decrease the spread of COVID-19 please remain isolated if you have COVID infection or are highly suspected to have COVID infection. This means -stay home and isolate to one room in the home if you live with others. Do not share a bed or bathroom with others while ill, sanitize and wipe down all countertops and keep common areas clean and disinfected. Stay home for 5 days. If you have no symptoms or your symptoms are resolving after 5 days, you can leave your house. Continue to wear a mask around others for 5 additional days. If you have been in close contact (within 6 feet) of someone diagnosed with COVID 19, you are advised to quarantine in your home for 14 days as symptoms can develop anywhere from 2-14 days after exposure to the virus. If you develop symptoms, you  must isolate.  Most current guidelines for COVID after exposure -unvaccinated: isolate 5 days and strict mask use x 5 days. Test on day 5 is possible -vaccinated: wear mask x 10 days if symptoms do not develop -You do not necessarily need to be tested for COVID if you have + exposure and  develop symptoms. Just isolate at home x10 days from symptom onset During this global pandemic,  CDC advises to practice social distancing, try to stay at least 21ft away from others at all times. Wear a face covering. Wash and sanitize your hands regularly and avoid going anywhere that is not necessary.  KEEP IN MIND THAT THE COVID TEST IS NOT 100% ACCURATE AND YOU SHOULD STILL DO EVERYTHING TO PREVENT POTENTIAL SPREAD OF VIRUS TO OTHERS (WEAR MASK, WEAR GLOVES,  [place] HANDS AND SANITIZE REGULARLY). IF INITIAL TEST IS NEGATIVE, THIS MAY NOT MEAN YOU ARE DEFINITELY NEGATIVE. MOST ACCURATE TESTING IS DONE 5-7 DAYS AFTER EXPOSURE.   It is not advised by CDC to get re-tested after receiving a positive COVID test since you can still test positive for weeks to months after you have already cleared the virus.   *If you have not been vaccinated for COVID, I strongly suggest you consider getting vaccinated as long as there are no contraindications.

## 2021-07-20 ENCOUNTER — Ambulatory Visit (INDEPENDENT_AMBULATORY_CARE_PROVIDER_SITE_OTHER): Payer: Managed Care, Other (non HMO) | Admitting: Family

## 2021-07-20 ENCOUNTER — Other Ambulatory Visit: Payer: Self-pay

## 2021-07-20 ENCOUNTER — Encounter: Payer: Self-pay | Admitting: Family

## 2021-07-20 ENCOUNTER — Ambulatory Visit (INDEPENDENT_AMBULATORY_CARE_PROVIDER_SITE_OTHER): Payer: Managed Care, Other (non HMO)

## 2021-07-20 VITALS — BP 118/82 | HR 83 | Temp 97.1°F | Ht 61.0 in | Wt 222.0 lb

## 2021-07-20 DIAGNOSIS — U071 COVID-19: Secondary | ICD-10-CM | POA: Diagnosis not present

## 2021-07-20 DIAGNOSIS — J309 Allergic rhinitis, unspecified: Secondary | ICD-10-CM

## 2021-07-20 DIAGNOSIS — R9431 Abnormal electrocardiogram [ECG] [EKG]: Secondary | ICD-10-CM | POA: Insufficient documentation

## 2021-07-20 DIAGNOSIS — R062 Wheezing: Secondary | ICD-10-CM

## 2021-07-20 DIAGNOSIS — R0789 Other chest pain: Secondary | ICD-10-CM | POA: Diagnosis not present

## 2021-07-20 DIAGNOSIS — J4531 Mild persistent asthma with (acute) exacerbation: Secondary | ICD-10-CM

## 2021-07-20 DIAGNOSIS — R002 Palpitations: Secondary | ICD-10-CM | POA: Insufficient documentation

## 2021-07-20 DIAGNOSIS — F411 Generalized anxiety disorder: Secondary | ICD-10-CM

## 2021-07-20 MED ORDER — MONTELUKAST SODIUM 10 MG PO TABS: 10.0000 mg | ORAL_TABLET | Freq: Every day | ORAL | 0 refills | Status: DC

## 2021-07-20 MED ORDER — AMOXICILLIN-POT CLAVULANATE 875-125 MG PO TABS: 1.0000 | ORAL_TABLET | Freq: Two times a day (BID) | ORAL | 0 refills | Status: DC

## 2021-07-20 MED ORDER — PREDNISONE 20 MG PO TABS: ORAL_TABLET | ORAL | 0 refills | Status: DC

## 2021-07-20 NOTE — Progress Notes (Signed)
Cardiology Office Note   Date:  07/21/2021   ID:  Sumner Boast, DOB 1986-06-11, MRN [id number]  PCP:  Lynnda Child, MD  Cardiologist:   Lorine Bears, MD   Chief Complaint  Patient presents with   Other    Chest pain,heaviness sob post covid. Meds reviewed verbally with pt.      History of Present Illness: Meredith Campbell is a 36 y.o. female who was referred by Mort Sawyers for evaluation of chest pain.  She is an internal/geriatric medicine physician. She has no prior cardiac history and no significant risk factors.  She has known history of asthma, allergies, GERD and depression.  She reports having an echocardiogram when she was in medical school due to palpitations in the setting of increased caffeine intake.  She was told there was no significant abnormality. She was seen recently in urgent care for sore throat, cough, chest tightness and shortness of breath.  She was febrile at that time.  She tested positive for COVID-19 and strep throat was negative.  Her strep throat and cough improved significantly.  However, she continues to have significant chest tightness which is currently happening mainly with exertion such as doing housework.  The tightness lasted for about 3 minutes and resolved with rest. She is a lifelong non-smoker.  There is no family history of premature coronary artery disease.  Her mother has diabetes and hypertension.  Past Medical History:  Diagnosis Date   Asthma    Chicken pox    Depression    GERD (gastroesophageal reflux disease)    Hay fever    Seasonal allergies     Past Surgical History:  Procedure Laterality Date   EAR CYST EXCISION       Current Outpatient Medications  Medication Sig Dispense Refill   albuterol (PROVENTIL) (2.5 MG/3ML) 0.083% nebulizer solution      albuterol (VENTOLIN HFA) 108 (90 Base) MCG/ACT inhaler Inhale 2 puffs into the lungs every 6 (six) hours as needed for wheezing or shortness of breath. 18 g 5    amoxicillin-clavulanate (AUGMENTIN) 875-125 MG tablet Take 1 tablet by mouth 2 (two) times daily. 20 tablet 0   budesonide-formoterol (SYMBICORT) 160-4.5 MCG/ACT inhaler INHALE 2 PUFFS INTO THE LUNGS TWICE DAILY. 10.2 g 11   fexofenadine (ALLEGRA) 180 MG tablet Take 180 mg by mouth daily.     montelukast (SINGULAIR) 10 MG tablet Take 1 tablet (10 mg total) by mouth at bedtime. 90 tablet 0   Multiple Vitamin (MULTIVITAMIN) tablet Take 1 tablet by mouth daily.     predniSONE (DELTASONE) 20 MG tablet Take two tablets po qd for five days 10 tablet 0   No current facility-administered medications for this visit.    Allergies:   Patient has no known allergies.    Social History:  The patient  reports that she has never smoked. She has never used smokeless tobacco. She reports current alcohol use. She reports that she does not use drugs.   Family History:  The patient's family history includes Diabetes in her maternal grandmother and mother; Hyperlipidemia in her mother; Hypertension in her maternal grandmother and mother; Kidney disease in her paternal uncle; Stroke in her maternal grandfather and paternal grandmother.    ROS:  Please see the history of present illness.   Otherwise, review of systems are positive for none.   All other systems are reviewed and negative.    PHYSICAL EXAM: VS:  BP 128/82 (BP Location: Right Arm, Patient Position: Sitting,  Cuff Size: Large)    Pulse 99    Ht 5\' 7"  (1.702 m)    Wt 222 lb 8 oz (100.9 kg)    LMP 06/24/2021 (Approximate)    SpO2 99%    BMI 34.85 kg/m  , BMI Body mass index is 34.85 kg/m. GEN: Well nourished, well developed, in no acute distress  HEENT: normal  Neck: no JVD, carotid bruits, or masses Cardiac: RRR; no rubs, or gallops,no edema .  1 out of 6 systolic murmur in the aortic area. Respiratory:  clear to auscultation bilaterally, normal work of breathing GI: soft, nontender, nondistended, + BS MS: no deformity or atrophy  Skin: warm and  dry, no rash Neuro:  Strength and sensation are intact Psych: euthymic mood, full affect   EKG:  EKG is ordered today. The ekg ordered today demonstrates sinus rhythm with minimal LVH.  Anterior T wave inversion possibly due to anterior ischemia.   Recent Labs: No results found for requested labs within last 8760 hours.    Lipid Panel No results found for: CHOL, TRIG, HDL, CHOLHDL, VLDL, LDLCALC, LDLDIRECT    Wt Readings from Last 3 Encounters:  07/21/21 222 lb 8 oz (100.9 kg)  07/20/21 222 lb (100.7 kg)  07/08/21 230 lb (104.3 kg)      PAD Screen 07/21/2021  Previous PAD dx? No  Previous surgical procedure? No  Pain with walking? No  Feet/toe relief with dangling? No  Painful, non-healing ulcers? No  Extremities discolored? No      ASSESSMENT AND PLAN:  1.  Exertional chest pain: In the setting of recent COVID-19 infection.  Her symptoms are consistent with angina and her EKG is abnormal with anterior T wave changes.  We discussed the cardiac manifestations of COVID-19 infection.  I recommend evaluation with a treadmill nuclear stress test.  In addition, we have to exclude cardiac involvement with COVID such as myocarditis or pericardial disease.  I requested an echocardiogram.    Disposition:   FU with me as needed if testing is abnormal. Signed,  07/23/2021, MD  07/21/2021 10:21 AM    [facility]

## 2021-07-20 NOTE — Patient Instructions (Addendum)
DecreasedComplete xray(s) prior to leaving today. I will notify you of your results once received.  An antibiotic  and prednisone was prescribed to your preferred pharmacy today, please pick up and take as directed. Please let me know if you do experience symptom improvement in the next 2-3 days. The goal is to decrease the amount of need for the albuterol inhaler. I do also suggest adding flonase daily to your regimen for about the next two weeks.    Your EKG was slightly abnormal today and I have referred you to cardiology.  If you do not hear back in the next 1 week please let me know.  Again and stressed in the office if you have any worsening chest pain shortness of breath and or just a feeling of something is not right please go immediately to the emergency room and/or call 911.  Caffeine intake.  It was a pleasure seeing you today! Please do not hesitate to reach out with any questions and or concerns.  Regards,   Mort Sawyers FNP-C

## 2021-07-20 NOTE — Progress Notes (Signed)
Established Patient Office Visit  Subjective:  Patient ID: Meredith Campbell, female    DOB: 04/01/1986  Age: 36 y.o. MRN: [id number]  CC:  Chief Complaint  Patient presents with   covid follow up   chest heavyness    constant    Chest Pain    Every day when up and moving about    Shortness of Breath    Everyday but its when moving around     HPI Meredith Campbell is here today with for follow up.   She was seen on July 08, 2021 in urgent care in [place] for a 2-day history complaint of sore throat cough congestion chest tightness and shortness of breath.  She did have a fever at that time as well that went up to 101.  At that visit she was found to be COVID-positive, strep PCR was negative.  Was treated with nonappearing of your antiviral and was advised to use birth control for 3 months after taking.  Cough medication was offered but patient declined and prednisone was also sent for the acute asthma exasperation.  Since then, pt still with c/o chest heaviness/ chest congestion. At first occurred while moving and at rest, since then is only noticed while moving around. Does have sob, but overall with improvement. Still some fatigue but overall that is better.   No longer with sore throat, nasal congestion, fever and or ear pain.  Using her Symbicort daily as directed, having to use her albuterol inhaler about 2-3 times a day. Does have a cough that is clear sputum, mostly dry non productive with some noted improvement in cough as well. Not having to use nebulizer .   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)  Past Medical History:  Diagnosis Date   Asthma    Chicken pox    Depression    GERD (gastroesophageal reflux disease)    Hay fever    Seasonal allergies     Past Surgical History:  Procedure Laterality Date   EAR CYST EXCISION      Family History   Problem Relation Age of Onset   Diabetes Mother    Hypertension Mother    Hyperlipidemia Mother    Kidney disease Paternal Uncle    Diabetes Maternal Grandmother    Hypertension Maternal Grandmother    Stroke Paternal Grandmother    Stroke Maternal Grandfather     Social History   Socioeconomic History   Marital status: Single    Spouse name: Not on file   Number of children: Not on file   Years of education: medical school   Highest education level: Not on file  Occupational History   Not on file  Tobacco Use   Smoking status: Never   Smokeless tobacco: Never  Vaping Use   Vaping Use: Never used  Substance and Sexual Activity   Alcohol use: Yes    Comment: less than once a year   Drug use: No   Sexual activity: Never    Birth control/protection: Abstinence  Other Topics Concern   Not on file  Social History Narrative   05/25/20   From: the area   Living: with sister   Work: Geriatric Medicine at [employer]      Family: mom and siblings nearby, extended family in [place]      Enjoys: gardening, painting, movies, plays  Exercise: not currently   Diet: could be better, skips meals,       Safety   Seat belts: Yes    Guns: No   Safe in relationships: Yes    Social Determinants of Corporate investment banker Strain: Not on file  Food Insecurity: Not on file  Transportation Needs: Not on file  Physical Activity: Not on file  Stress: Not on file  Social Connections: Not on file  Intimate Partner Violence: Not on file    Outpatient Medications Prior to Visit  Medication Sig Dispense Refill   albuterol (PROVENTIL) (2.5 MG/3ML) 0.083% nebulizer solution      albuterol (VENTOLIN HFA) 108 (90 Base) MCG/ACT inhaler Inhale 2 puffs into the lungs every 6 (six) hours as needed for wheezing or shortness of breath. 18 g 5   budesonide-formoterol (SYMBICORT) 160-4.5 MCG/ACT inhaler INHALE 2 PUFFS INTO THE LUNGS TWICE DAILY. 10.2 g 11   fexofenadine (ALLEGRA) 180 MG tablet  Take 180 mg by mouth daily.     sertraline (ZOLOFT) 100 MG tablet TAKE 1 TABLET BY MOUTH EVERY MORNING 90 tablet 1   buPROPion (WELLBUTRIN) 75 MG tablet TAKE 1 TABLET BY MOUTH TWICE DAILY. 60 tablet 1   cetirizine (ZYRTEC) 10 MG tablet Take 10 mg by mouth daily.     cetirizine (ZYRTEC) 10 MG tablet Take 10 mg by mouth daily.     Cholecalciferol 1.25 MG (50000 UT) TABS Take 1 tablet by mouth once a week. 4 tablet 1   EPINEPHrine (EPIPEN 2-PAK) 0.3 mg/0.3 mL IJ SOAJ injection Use as directed for systemic reactions 2 each 0   EPINEPHrine 0.3 mg/0.3 mL IJ SOAJ injection Inject into the muscle as directed.     FERROUS GLUCONATE PO Take 10 mg by mouth in the morning and at bedtime.     LORazepam (ATIVAN) 0.5 MG tablet Take 0.25-0.5 mg by mouth every 6 (six) hours as needed.     montelukast (SINGULAIR) 10 MG tablet TAKE 1 TABLET (10 MG TOTAL) BY MOUTH AT BEDTIME. 90 tablet 3   oseltamivir (TAMIFLU) 75 MG capsule Take 1 capsule (75 mg total) by mouth 2 (two) times daily. 10 capsule 0   No facility-administered medications prior to visit.    No Known Allergies  ROS Review of Systems  Constitutional:  Negative for chills and fever.  HENT:  Negative for congestion, ear pain, sinus pressure and sore throat.   Respiratory:  Positive for cough (dry with occasional clear sputum), shortness of breath (increased use of albuterol inhaler) and wheezing.   Cardiovascular:  Positive for chest pain (intermittent) and palpitations (at times).     Objective:    Physical Exam Constitutional:      General: She is not in acute distress.    Appearance: Normal appearance. She is obese. She is not ill-appearing, toxic-appearing or diaphoretic.  HENT:     Head: Normocephalic.     Right Ear: Tympanic membrane normal.     Left Ear: A middle ear effusion (clear) is present. Tympanic membrane is not erythematous or bulging.     Nose: Nose normal.     Mouth/Throat:     Mouth: Mucous membranes are moist.  Eyes:      Pupils: Pupils are equal, round, and reactive to light.  Cardiovascular:     Rate and Rhythm: Normal rate and regular rhythm.  Pulmonary:     Effort: Pulmonary effort is normal. No respiratory distress.     Breath sounds: Wheezing present. No  rhonchi or rales (right upper lobe on inspiration).  Neurological:     Mental Status: She is alert.    BP 118/82    Pulse 83    Temp (!) 97.1 F (36.2 C) (Temporal)    Ht 5\' 1"  (1.549 m)    Wt 222 lb (100.7 kg)    LMP 06/24/2021 (Approximate)    SpO2 98%    BMI 41.95 kg/m  Wt Readings from Last 3 Encounters:  07/20/21 222 lb (100.7 kg)  07/08/21 230 lb (104.3 kg)  07/01/20 226 lb 12.8 oz (102.9 kg)     Health Maintenance Due  Topic Date Due   Pneumococcal Vaccine 6119-38 Years old (1 - PCV) Never done   HIV Screening  Never done   Hepatitis C Screening  Never done   COVID-19 Vaccine (4 - Booster for Pfizer series) 09/07/2020    There are no preventive care reminders to display for this patient.  Lab Results  Component Value Date   TSH 1.65 05/25/2020   Lab Results  Component Value Date   WBC 6.1 05/25/2020   HGB 13.4 05/25/2020   HCT 38.6 05/25/2020   MCV 87.1 05/25/2020   PLT 247.0 05/25/2020   Lab Results  Component Value Date   NA 138 05/25/2020   K 3.8 05/25/2020   CO2 30 05/25/2020   GLUCOSE 90 05/25/2020   BUN 6 05/25/2020   CREATININE 0.76 05/25/2020   BILITOT 0.6 05/25/2020   ALKPHOS 74 05/25/2020   AST 17 05/25/2020   ALT 11 05/25/2020   PROT 7.1 05/25/2020   ALBUMIN 4.2 05/25/2020   CALCIUM 9.4 05/25/2020   GFR 100.97 05/25/2020   No results found for: HGBA1C    Assessment & Plan:   Problem List Items Addressed This Visit       Respiratory   Chronic allergic rhinitis   Relevant Medications   montelukast (SINGULAIR) 10 MG tablet   Mild persistent asthma    Goal is to cut down on the need for albuterol as needed however she is currently with an exacerbation so we are going to give a prednisone 40 mg  once daily for the next 5 days along with some antibiotic to see if symptoms improve.  I have also suggested that patient start Flonase daily as well as a daily antihistamine.      Relevant Medications   montelukast (SINGULAIR) 10 MG tablet   predniSONE (DELTASONE) 20 MG tablet     Other   Generalized anxiety disorder    Patient states that  she has been taking the Zoloft only around the time of her menses.  I advised patient that this is not the correct administration of Zoloft and can result in unwanted side effects.  I advised her if she is to continue with the Zoloft that she should take it daily she will have the biggest therapeutic effects.Patient states she knows she has been taking Neurontin but will consider taking it daily again and/or not taking at all.      COVID-19    Augmentin 875/125 mg p.o. twice daily for 10 days sent to the pharmacy patient to start taking as directed.  Advised of CDC guidelines for self isolation/ ending isolation.  Advised of safe practice guidelines. Symptom Tier reviewed.  Encouraged to monitor for any worsening symptoms; watch for increased shortness of breath, weakness, and signs of dehydration. Advised when to seek emergency care.  Instructed to rest and hydrate well.  Advised to leave the house  during recommended isolation period, only if it is necessary to seek medical care       Relevant Medications   amoxicillin-clavulanate (AUGMENTIN) 875-125 MG tablet   Other Relevant Orders   DG Chest 2 View (Completed)   Wheezing - Primary    Chest x-ray ordered pending results.  Prednisone 40 mg 1 p.o. daily for 5 days prescribed for patient.      Relevant Medications   predniSONE (DELTASONE) 20 MG tablet   Other Relevant Orders   DG Chest 2 View (Completed)   Other chest pain    EKG was ordered today      Relevant Orders   EKG 12-Lead   Ambulatory referral to Cardiology   Palpitations    EKG was ordered today and evaluated.  Refer to cardiology.   Limit caffeine intake      Relevant Orders   EKG 12-Lead   Ambulatory referral to Cardiology   Abnormal finding on EKG    Unspecified findings on EKG with decelerations and multiple V leads.  Advised patient if any worsening chest pain and/or sudden onset shortness of breath to immediately go to the emergency room and or call 911.  I have referred patient to a cardiologist for a stat eval patient to let me know if she does not hear from the referral team in the next few days.      Relevant Orders   Ambulatory referral to Cardiology    Meds ordered this encounter  Medications   montelukast (SINGULAIR) 10 MG tablet    Sig: Take 1 tablet (10 mg total) by mouth at bedtime.    Dispense:  90 tablet    Refill:  0    Order Specific Question:   Supervising Provider    Answer:   BEDSOLE, AMY E [[id number]]   predniSONE (DELTASONE) 20 MG tablet    Sig: Take two tablets po qd for five days    Dispense:  10 tablet    Refill:  0    Order Specific Question:   Supervising Provider    Answer:   BEDSOLE, AMY E [[id number]]   amoxicillin-clavulanate (AUGMENTIN) 875-125 MG tablet    Sig: Take 1 tablet by mouth 2 (two) times daily.    Dispense:  20 tablet    Refill:  0    Order Specific Question:   Supervising Provider    Answer:   BEDSOLE, AMY E [[id number]]    Follow-up: Return if symptoms worsen or fail to improve.    Mort Sawyersabitha Beverly Suriano, FNP

## 2021-07-21 ENCOUNTER — Ambulatory Visit (INDEPENDENT_AMBULATORY_CARE_PROVIDER_SITE_OTHER): Payer: Managed Care, Other (non HMO) | Admitting: Cardiovascular Disease

## 2021-07-21 ENCOUNTER — Encounter: Payer: Self-pay | Admitting: Cardiovascular Disease

## 2021-07-21 VITALS — BP 128/82 | HR 99 | Ht 67.0 in | Wt 222.5 lb

## 2021-07-21 DIAGNOSIS — R0602 Shortness of breath: Secondary | ICD-10-CM

## 2021-07-21 DIAGNOSIS — R079 Chest pain, unspecified: Secondary | ICD-10-CM

## 2021-07-21 NOTE — Assessment & Plan Note (Signed)
Unspecified findings on EKG with decelerations and multiple V leads.  Advised patient if any worsening chest pain and/or sudden onset shortness of breath to immediately go to the emergency room and or call 911.  I have referred patient to a cardiologist for a stat eval patient to let me know if she does not hear from the referral team in the next few days.

## 2021-07-21 NOTE — Assessment & Plan Note (Signed)
Chest x-ray ordered pending results.  Prednisone 40 mg 1 p.o. daily for 5 days prescribed for patient.

## 2021-07-21 NOTE — Patient Instructions (Signed)
Medication Instructions:  Your physician recommends that you continue on your current medications as directed. Please refer to the Current Medication list given to you today.  *If you need a refill on your cardiac medications before your next appointment, please call your pharmacy*   Lab Work: None ordered  If you have labs (blood work) drawn today and your tests are completely normal, you will receive your results only by: [place] (if you have MyChart) OR A paper copy in the mail If you have any lab test that is abnormal or we need to change your treatment, we will call you to review the results.   Testing/Procedures: Your physician has requested that you have an echocardiogram. Echocardiography is a painless test that uses sound waves to create images of your heart. It provides your doctor with information about the size and shape of your heart and how well your hearts chambers and valves are working. This procedure takes approximately one hour. There are no restrictions for this procedure.  Your physician has requested that you have en exercise stress myoview. For further information please visit [email or web address]. Please follow instruction sheet, as given.    Follow-Up: At [facility], you and your health needs are our priority.  As part of our continuing mission to provide you with exceptional heart care, we have created designated Provider Care Teams.  These Care Teams include your primary Cardiologist (physician) and Advanced Practice Providers (APPs -  Physician Assistants and Nurse Practitioners) who all work together to provide you with the care you need, when you need it.  We recommend signing up for the patient portal called "MyChart".  Sign up information is provided on this After Visit Summary.  MyChart is used to connect with patients for Virtual Visits (Telemedicine).  Patients are able to view lab/test results, encounter notes, upcoming appointments, etc.   Non-urgent messages can be sent to your provider as well.   To learn more about what you can do with MyChart, go to [email or web address].    Your next appointment:   As needed  The format for your next appointment:   In Person  Provider:   You may see Kathlyn Sacramento, MD or one of the following Advanced Practice Providers on your designated Care Team:   Murray Hodgkins, NP Christell Faith, PA-C Cadence Kathlen Mody, PA-C{    Other Instructions [place]  Your caregiver has ordered a Stress Test with nuclear imaging. The purpose of this test is to evaluate the blood supply to your heart muscle. This procedure is referred to as a "Non-Invasive Stress Test." This is because other than having an IV started in your vein, nothing is inserted or "invades" your body. Cardiac stress tests are done to find areas of poor blood flow to the heart by determining the extent of coronary artery disease (CAD). Some patients exercise on a treadmill, which naturally increases the blood flow to your heart, while others who are  unable to walk on a treadmill due to physical limitations have a pharmacologic/chemical stress agent called Lexiscan . This medicine will mimic walking on a treadmill by temporarily increasing your coronary blood flow.   Please note: these test may take anywhere between 2-4 hours to complete  PLEASE REPORT TO [place] AT THE FIRST DESK WILL DIRECT YOU WHERE TO GO  Date of Procedure:_____________________________________  Arrival Time for Procedure:______________________________  Instructions regarding medication:    PLEASE NOTIFY THE OFFICE AT LEAST 24 HOURS IN ADVANCE IF  YOU ARE UNABLE TO KEEP YOUR APPOINTMENT.  [phone] AND  PLEASE NOTIFY NUCLEAR MEDICINE AT [facility] AT LEAST 24 HOURS IN ADVANCE IF YOU ARE UNABLE TO KEEP YOUR APPOINTMENT. [phone]  How to prepare for your Myoview test:  Do not eat or drink after midnight No caffeine for 24  hours prior to test No smoking 24 hours prior to test. Your medication may be taken with water.  If your doctor stopped a medication because of this test, do not take that medication. Ladies, please do not wear dresses.  Skirts or pants are appropriate. Please wear a short sleeve shirt. No perfume or lotion. Wear comfortable walking shoes. No heels! 8.   Bring your inhaler if you use one

## 2021-07-21 NOTE — Assessment & Plan Note (Signed)
EKG was ordered today and evaluated.  Refer to cardiology.  Limit caffeine intake

## 2021-07-21 NOTE — Assessment & Plan Note (Signed)
Goal is to cut down on the need for albuterol as needed however she is currently with an exacerbation so we are going to give a prednisone 40 mg once daily for the next 5 days along with some antibiotic to see if symptoms improve.  I have also suggested that patient start Flonase daily as well as a daily antihistamine.

## 2021-07-21 NOTE — Assessment & Plan Note (Signed)
Patient states that  she has been taking the Zoloft only around the time of her menses.  I advised patient that this is not the correct administration of Zoloft and can result in unwanted side effects.  I advised her if she is to continue with the Zoloft that she should take it daily she will have the biggest therapeutic effects.Patient states she knows she has been taking Neurontin but will consider taking it daily again and/or not taking at all.

## 2021-07-21 NOTE — Assessment & Plan Note (Signed)
Augmentin 875/125 mg p.o. twice daily for 10 days sent to the pharmacy patient to start taking as directed.  Advised of CDC guidelines for self isolation/ ending isolation.  Advised of safe practice guidelines. Symptom Tier reviewed.  Encouraged to monitor for any worsening symptoms; watch for increased shortness of breath, weakness, and signs of dehydration. Advised when to seek emergency care.  Instructed to rest and hydrate well.  Advised to leave the house during recommended isolation period, only if it is necessary to seek medical care

## 2021-07-21 NOTE — Assessment & Plan Note (Signed)
EKG was ordered today

## 2021-07-26 ENCOUNTER — Encounter
Admission: RE | Admit: 2021-07-26 | Discharge: 2021-07-26 | Disposition: A | Discharge status: Home / Self Care | Payer: Managed Care, Other (non HMO) | Source: Ambulatory Visit | Attending: Cardiovascular Disease | Admitting: Cardiovascular Disease

## 2021-07-26 ENCOUNTER — Other Ambulatory Visit: Payer: Self-pay

## 2021-07-26 DIAGNOSIS — R079 Chest pain, unspecified: Secondary | ICD-10-CM | POA: Diagnosis present

## 2021-07-26 LAB — NM MYOCAR MULTI W/SPECT W/WALL MOTION / EF
Estimated workload: 1
Exercise duration (min): 0 min
Exercise duration (sec): 0 s
LV dias vol: 103 mL (ref 46–106)
LV sys vol: 36 mL
MPHR: 183 {beats}/min
Nuc Stress EF: 65 %
Peak HR: 123 {beats}/min
Percent HR: 67 %
Rest HR: 63 {beats}/min
Rest Nuclear Isotope Dose: 10.2 mCi
SDS: 0
SRS: 18
SSS: 13
Stress Nuclear Isotope Dose: 32.4 mCi
TID: 0.94

## 2021-07-26 MED ORDER — TECHNETIUM TC 99M TETROFOSMIN IV KIT
32.3700 | PACK | Freq: Once | INTRAVENOUS | Status: AC | PRN
  Administered 2021-07-26: 12:00:00 32.37 via INTRAVENOUS

## 2021-07-26 MED ORDER — REGADENOSON 0.4 MG/5ML IV SOLN
0.4000 mg | Freq: Once | INTRAVENOUS | Status: DC
  Filled 2021-07-26: qty 5

## 2021-07-26 MED ORDER — TECHNETIUM TC 99M TETROFOSMIN IV KIT
10.1900 | PACK | Freq: Once | INTRAVENOUS | Status: AC | PRN
  Administered 2021-07-26: 11:00:00 10.19 via INTRAVENOUS

## 2021-07-27 ENCOUNTER — Telehealth: Payer: Self-pay

## 2021-07-27 NOTE — Telephone Encounter (Signed)
Patient made aware of stress test results with verbalized understanding. 

## 2021-07-27 NOTE — Telephone Encounter (Signed)
Called to give the patient stress test results. Lmtcb. °

## 2021-07-27 NOTE — Telephone Encounter (Signed)
-----   Message from Wellington Hampshire, MD sent at 07/26/2021  4:57 PM EST ----- Inform patient that  stress test was normal.

## 2021-08-11 ENCOUNTER — Other Ambulatory Visit: Payer: Self-pay

## 2021-08-11 ENCOUNTER — Ambulatory Visit (INDEPENDENT_AMBULATORY_CARE_PROVIDER_SITE_OTHER): Payer: Managed Care, Other (non HMO)

## 2021-08-11 DIAGNOSIS — R0602 Shortness of breath: Secondary | ICD-10-CM | POA: Diagnosis not present

## 2021-08-11 LAB — ECHOCARDIOGRAM COMPLETE
Area-P 1/2: 3.48 cm2
Calc EF: 64.6 %
S' Lateral: 3.5 cm
Single Plane A2C EF: 62.3 %
Single Plane A4C EF: 67.2 %

## 2021-08-18 ENCOUNTER — Other Ambulatory Visit: Payer: Self-pay

## 2021-08-18 ENCOUNTER — Encounter: Payer: Self-pay | Admitting: Family Medicine

## 2021-08-18 ENCOUNTER — Ambulatory Visit (INDEPENDENT_AMBULATORY_CARE_PROVIDER_SITE_OTHER): Payer: Managed Care, Other (non HMO) | Admitting: Family Medicine

## 2021-08-18 VITALS — BP 128/86 | HR 88 | Temp 98.1°F | Ht 61.5 in | Wt 221.5 lb

## 2021-08-18 DIAGNOSIS — Z0000 Encounter for general adult medical examination without abnormal findings: Secondary | ICD-10-CM | POA: Diagnosis not present

## 2021-08-18 DIAGNOSIS — F331 Major depressive disorder, recurrent, moderate: Secondary | ICD-10-CM

## 2021-08-18 DIAGNOSIS — J4530 Mild persistent asthma, uncomplicated: Secondary | ICD-10-CM

## 2021-08-18 DIAGNOSIS — F411 Generalized anxiety disorder: Secondary | ICD-10-CM

## 2021-08-18 DIAGNOSIS — J309 Allergic rhinitis, unspecified: Secondary | ICD-10-CM

## 2021-08-18 MED ORDER — ALBUTEROL SULFATE HFA 108 (90 BASE) MCG/ACT IN AERS: 2.0000 | INHALATION_SPRAY | Freq: Four times a day (QID) | RESPIRATORY_TRACT | 5 refills | Status: DC | PRN

## 2021-08-18 MED ORDER — MONTELUKAST SODIUM 10 MG PO TABS: 10.0000 mg | ORAL_TABLET | Freq: Every day | ORAL | 0 refills | Status: DC

## 2021-08-18 MED ORDER — BUDESONIDE-FORMOTEROL FUMARATE 160-4.5 MCG/ACT IN AERO: 2.0000 | INHALATION_SPRAY | Freq: Two times a day (BID) | RESPIRATORY_TRACT | 11 refills | Status: DC

## 2021-08-18 MED ORDER — SERTRALINE HCL 100 MG PO TABS: 150.0000 mg | ORAL_TABLET | Freq: Every day | ORAL | 1 refills | Status: DC

## 2021-08-18 MED ORDER — ALBUTEROL SULFATE (2.5 MG/3ML) 0.083% IN NEBU: 2.5000 mg | INHALATION_SOLUTION | Freq: Four times a day (QID) | RESPIRATORY_TRACT | 1 refills | Status: DC | PRN

## 2021-08-18 NOTE — Assessment & Plan Note (Signed)
Increase Zoloft from 100 to 150 mg

## 2021-08-18 NOTE — Assessment & Plan Note (Signed)
Persistent symptoms.  Increase Zoloft 100-150.  Patient starting therapy.  Return in 2 months.

## 2021-08-18 NOTE — Assessment & Plan Note (Signed)
Improved following COVID infection.  Continue as needed albuterol and slowly increasing exercise tolerance.  Continue Symbicort

## 2021-08-18 NOTE — Progress Notes (Signed)
Annual Exam   Chief Complaint:  Chief Complaint  Patient presents with   Annual Exam   Shortness of Breath    Upon exertion, Post covid     History of Present Illness:  Ms. Meredith Campbell is a 36 y.o. No obstetric history on file. who LMP was Patient's last menstrual period was 08/10/2021 (exact date)., presents today for her annual examination.    Overall improving  Covid at the beginning   Anxiety/depression - taking zoloft - anxiety is improved - she left her job w/ improvement - worse at her cycles - has more physical impacts of anxiety - is doing therapy now - is trying to be more mindful  Nutrition/Lifestyle Diet: pretty, eating more regularly - light breakfast (fruit, cottage cheese), lunch - salad, dinner - eating out, needs to work on this, sometimes skipping, sometimes snacking Exercise: not currently - was doing walking program  She does get adequate calcium and Vitamin D in her diet.  Social History   Tobacco Use  Smoking Status Never  Smokeless Tobacco Never   Social History   Substance and Sexual Activity  Alcohol Use Yes   Comment: less than once a year   Social History   Substance and Sexual Activity  Drug Use No     Safety The patient wears seatbelts: yes.     The patient feels safe at home and in their relationships: yes.  General Health Dentist in the last year: No Eye doctor: no  Making appointments  Menstrual Her menses are regular every 28-30 days, Lighter than before   GYN She is not sexually active.     Cervical Cancer Screening (Age 42-65) Last Pap:  July 2020 Results were: no abnormalities with HPV negative  Family History of Breast Cancer: no Family History of Ovarian Cancer: no    Weight Wt Readings from Last 3 Encounters:  08/18/21 221 lb 8 oz (100.5 kg)  07/21/21 222 lb 8 oz (100.9 kg)  07/20/21 222 lb (100.7 kg)   Patient has very high BMI  BMI Readings from Last 1 Encounters:  08/18/21 41.17 kg/m      Chronic disease screening Blood pressure monitoring:  BP Readings from Last 3 Encounters:  08/18/21 128/86  07/21/21 128/82  07/20/21 118/82     Lipid Monitoring: Indication for screening: age >16, obesity, diabetes, family hx, CV risk factors.  Lipid screening: up to date  No results found for: CHOL, HDL, LDLCALC, LDLDIRECT, TRIG, CHOLHDL   Diabetes Screening: age >42, overweight, family hx, PCOS, hx of gestational diabetes, at risk ethnicity, elevated blood pressure >135/80.  Diabetes Screening screening: outside labs reviewed, no DM  No results found for: HGBA1C    Past Medical History:  Diagnosis Date   Asthma    Chicken pox    Depression    GERD (gastroesophageal reflux disease)    Hay fever    Seasonal allergies     Past Surgical History:  Procedure Laterality Date   EAR CYST EXCISION      Prior to Admission medications   Medication Sig Start Date End Date Taking? Authorizing Provider  albuterol (PROVENTIL) (2.5 MG/3ML) 0.083% nebulizer solution    Yes [provider]  albuterol (VENTOLIN HFA) 108 (90 Base) MCG/ACT inhaler Inhale 2 puffs into the lungs every 6 (six) hours as needed for wheezing or shortness of breath. 04/29/19  Yes Guse, Janna Arch, FNP  budesonide-formoterol (SYMBICORT) 160-4.5 MCG/ACT inhaler INHALE 2 PUFFS INTO THE LUNGS TWICE DAILY. 07/01/20 12/14/22 Yes  Lynnda Child, MD  fexofenadine (ALLEGRA) 180 MG tablet Take 180 mg by mouth as needed. 07/03/16  Yes [provider]  montelukast (SINGULAIR) 10 MG tablet Take 1 tablet (10 mg total) by mouth at bedtime. 07/20/21 10/18/21 Yes Dugal, Wyatt Mage, FNP  Multiple Vitamin (MULTIVITAMIN) tablet Take 1 tablet by mouth 2 (two) times daily.   Yes [provider]  sertraline (ZOLOFT) 100 MG tablet Take 100 mg by mouth daily.   Yes [provider]    No Known Allergies  Gynecologic History: Patient's last menstrual period was 08/10/2021 (exact date).  Obstetric  History: No obstetric history on file.  Social History   Socioeconomic History   Marital status: Single    Spouse name: Not on file   Number of children: Not on file   Years of education: medical school   Highest education level: Not on file  Occupational History   Not on file  Tobacco Use   Smoking status: Never   Smokeless tobacco: Never  Vaping Use   Vaping Use: Never used  Substance and Sexual Activity   Alcohol use: Yes    Comment: less than once a year   Drug use: No   Sexual activity: Never    Birth control/protection: Abstinence  Other Topics Concern   Not on file  Social History Narrative   05/25/20   From: the area   Living: with sister   Work: Geriatric Medicine - at [employer]      Family: mom and siblings nearby, extended family in [place]      Enjoys: gardening, painting, movies, plays      Exercise: not currently   Diet: could be better, skips meals,       Safety   Seat belts: Yes    Guns: No   Safe in relationships: Yes    Social Determinants of Corporate investment banker Strain: Not on file  Food Insecurity: Not on file  Transportation Needs: Not on file  Physical Activity: Not on file  Stress: Not on file  Social Connections: Not on file  Intimate Partner Violence: Not on file    Family History  Problem Relation Age of Onset   Diabetes Mother    Hypertension Mother    Hyperlipidemia Mother    Kidney disease Paternal Uncle    Diabetes Maternal Grandmother    Hypertension Maternal Grandmother    Stroke Paternal Grandmother    Stroke Maternal Grandfather     Review of Systems  Constitutional:  Negative for chills and fever.  HENT:  Negative for congestion and sore throat.   Eyes:  Negative for blurred vision and double vision.  Respiratory:  Positive for shortness of breath.   Cardiovascular:  Negative for chest pain.  Gastrointestinal:  Negative for heartburn, nausea and vomiting.  Genitourinary: Negative.    Musculoskeletal: Negative.  Negative for myalgias.  Skin:  Negative for rash.  Neurological:  Negative for dizziness and headaches.  Endo/Heme/Allergies:  Does not bruise/bleed easily.  Psychiatric/Behavioral:  Negative for depression. The patient is nervous/anxious.     Physical Exam BP 128/86    Pulse 88    Temp 98.1 F (36.7 C) (Oral)    Ht 5' 1.5" (1.562 m)    Wt 221 lb 8 oz (100.5 kg)    LMP 08/10/2021 (Exact Date)    SpO2 99%    BMI 41.17 kg/m    BP Readings from Last 3 Encounters:  08/18/21 128/86  07/21/21 128/82  07/20/21 118/82    Wt Readings from Last 3 Encounters:  08/18/21 221 lb 8 oz (100.5 kg)  07/21/21 222 lb 8 oz (100.9 kg)  07/20/21 222 lb (100.7 kg)     Physical Exam Constitutional:      General: She is not in acute distress.    Appearance: She is well-developed. She is not diaphoretic.  HENT:     Head: Normocephalic and atraumatic.     Right Ear: External ear normal.     Left Ear: External ear normal.     Nose: Nose normal.  Eyes:     General: No scleral icterus.    Extraocular Movements: Extraocular movements intact.     Conjunctiva/sclera: Conjunctivae normal.  Cardiovascular:     Rate and Rhythm: Normal rate and regular rhythm.     Heart sounds: No murmur heard. Pulmonary:     Effort: Pulmonary effort is normal. No respiratory distress.     Breath sounds: Normal breath sounds. No wheezing.  Abdominal:     General: Bowel sounds are normal. There is no distension.     Palpations: Abdomen is soft. There is no mass.     Tenderness: There is no abdominal tenderness. There is no guarding or rebound.  Musculoskeletal:        General: Normal range of motion.     Cervical back: Neck supple.  Lymphadenopathy:     Cervical: No cervical adenopathy.  Skin:    General: Skin is warm and dry.     Capillary Refill: Capillary refill takes less than 2 seconds.  Neurological:     Mental Status: She is alert and oriented to person, place, and time.      Deep Tendon Reflexes: Reflexes normal.  Psychiatric:        Mood and Affect: Mood normal.        Behavior: Behavior normal.      Results: Depression screen [facility] 2/9 08/18/2021 07/20/2021 07/01/2020  Decreased Interest 0 0 1  Down, Depressed, Hopeless 0 0 1  PHQ - 2 Score 0 0 2  Altered sleeping 1 0 1  Tired, decreased energy 2 0 1  Change in appetite 1 0 0  Feeling bad or failure about yourself  0 0 0  Trouble concentrating 0 0 0  Moving slowly or fidgety/restless 0 0 0  Suicidal thoughts 0 0 0  PHQ-9 Score 4 0 4  Difficult doing work/chores Somewhat difficult Not difficult at all Not difficult at all      Assessment: 36 y.o. No obstetric history on file. female here for routine annual examination.  Plan: Problem List Items Addressed This Visit       Respiratory   Chronic allergic rhinitis   Relevant Medications   montelukast (SINGULAIR) 10 MG tablet   budesonide-formoterol (SYMBICORT) 160-4.5 MCG/ACT inhaler   Mild persistent asthma    Improved following COVID infection.  Continue as needed albuterol and slowly increasing exercise tolerance.  Continue Symbicort      Relevant Medications   albuterol (VENTOLIN HFA) 108 (90 Base) MCG/ACT inhaler   albuterol (PROVENTIL) (2.5 MG/3ML) 0.083% nebulizer solution   montelukast (SINGULAIR) 10 MG tablet   budesonide-formoterol (SYMBICORT) 160-4.5 MCG/ACT inhaler     Other   Generalized anxiety disorder - Primary    Persistent symptoms.  Increase Zoloft 100-150.  Patient starting therapy.  Return in 2 months.      Relevant Medications   sertraline (ZOLOFT) 100 MG tablet   Major depressive disorder, recurrent (HCC)  Increase Zoloft from 100 to 150 mg      Relevant Medications   sertraline (ZOLOFT) 100 MG tablet   Other Visit Diagnoses     Annual physical exam            Screening: -- Blood pressure screen normal -- cholesterol screening:  normal -- Weight screening: obese: discussed management options,  including lifestyle, dietary, and exercise. -- Diabetes Screening:  negative -- Nutrition: encouraged healthy diet   Psych -- Depression screening (PHQ-9):  Flowsheet Row [facility]  PHQ-9 Total Score 4        Safety -- tobacco screening: not using -- alcohol screening:  low-risk usage. -- no evidence of domestic violence or intimate partner violence.  Cancer Screening -- pap smear not collected per ASCCP guidelines -- family history of breast cancer screening: done. not at high risk.   Immunizations Immunization History  Administered Date(s) Administered   Influenza,inj,Quad PF,6+ Mos 04/19/2020   PFIZER(Purple Top)SARS-COV-2 Vaccination 07/21/2019, 08/11/2019, 07/13/2020   Tdap 11/11/2013    -- flu vaccine up to date -- TDAP q10 years up to date -- Covid-19 Vaccine up to date  Encouraged regular vision and dental screening. Encouraged healthy exercise and diet.   Lynnda ChildJessica R [place]

## 2021-08-18 NOTE — Addendum Note (Signed)
Addended by: Lynnda Child on: 08/18/2021 01:57 PM   Modules accepted: Orders

## 2021-10-21 ENCOUNTER — Ambulatory Visit (INDEPENDENT_AMBULATORY_CARE_PROVIDER_SITE_OTHER): Payer: Managed Care, Other (non HMO) | Admitting: Family Medicine

## 2021-10-21 DIAGNOSIS — J309 Allergic rhinitis, unspecified: Secondary | ICD-10-CM

## 2021-10-21 DIAGNOSIS — F325 Major depressive disorder, single episode, in full remission: Secondary | ICD-10-CM | POA: Diagnosis not present

## 2021-10-21 DIAGNOSIS — J4530 Mild persistent asthma, uncomplicated: Secondary | ICD-10-CM | POA: Diagnosis not present

## 2021-10-21 MED ORDER — MONTELUKAST SODIUM 10 MG PO TABS: 10.0000 mg | ORAL_TABLET | Freq: Every day | ORAL | 3 refills | Status: DC

## 2021-10-21 MED ORDER — PREDNISONE 20 MG PO TABS: 40.0000 mg | ORAL_TABLET | Freq: Every day | ORAL | 0 refills | Status: AC

## 2021-10-21 NOTE — Progress Notes (Signed)
? ?Subjective:  ? ?  ?Meredith Campbell is a 36 y.o. female presenting for Follow-up (2 mo dep/anx) ?  ? ? ?HPI ? ?#anxiety/depression ?- much better ?- still seeing therapist once weekly ?- doing well on zoloft 150 mg ? ?#Asthma ?- is bothering her recently  ?- was getting better ?- going outside walking ?- chest tightness ?- wheezing ?- using symbicort, singulair ?- using albuterol twice daily the last week ?- feels she is overall handing the allergy season better ? ?Review of Systems ? ? ?Social History  ? ?Tobacco Use  ?Smoking Status Never  ?Smokeless Tobacco Never  ? ? ? ?   ?Objective:  ?  ?BP Readings from Last 3 Encounters:  ?10/21/21 130/90  ?08/18/21 128/86  ?07/21/21 128/82  ? ?Wt Readings from Last 3 Encounters:  ?10/21/21 227 lb 6 oz (103.1 kg)  ?08/18/21 221 lb 8 oz (100.5 kg)  ?07/21/21 222 lb 8 oz (100.9 kg)  ? ? ?BP 130/90   Pulse 100   Temp 98.3 ?F (36.8 ?C) (Oral)   Ht 5' 1.5" (1.562 m)   Wt 227 lb 6 oz (103.1 kg)   LMP 10/15/2021 (Exact Date)   SpO2 98%   BMI 42.27 kg/m?  ? ? ?Physical Exam ?Constitutional:   ?   General: She is not in acute distress. ?   Appearance: She is well-developed. She is not diaphoretic.  ?HENT:  ?   Right Ear: External ear normal.  ?   Left Ear: External ear normal.  ?   Nose: Nose normal.  ?Eyes:  ?   Conjunctiva/sclera: Conjunctivae normal.  ?Cardiovascular:  ?   Rate and Rhythm: Normal rate and regular rhythm.  ?   Heart sounds: No murmur heard. ?Pulmonary:  ?   Effort: Pulmonary effort is normal. No respiratory distress.  ?   Breath sounds: Normal breath sounds. No wheezing.  ?Musculoskeletal:  ?   Cervical back: Neck supple.  ?Skin: ?   General: Skin is warm and dry.  ?   Capillary Refill: Capillary refill takes less than 2 seconds.  ?Neurological:  ?   Mental Status: She is alert. Mental status is at baseline.  ?Psychiatric:     ?   Mood and Affect: Mood normal.     ?   Behavior: Behavior normal.  ? ? ? ? ? ?   ?Assessment & Plan:  ? ?Problem List Items  Addressed This Visit   ? ?  ? Respiratory  ? Chronic allergic rhinitis  ?  Stable on Singulair and as needed Allegra.  Refill Singulair ? ?  ?  ? Relevant Medications  ? predniSONE (DELTASONE) 20 MG tablet  ? montelukast (SINGULAIR) 10 MG tablet  ? Other Relevant Orders  ? Ambulatory referral to Pulmonology  ? Mild persistent asthma  ?  Per prior history symptoms sound consistent with possible mild exacerbation.  Lungs without significant wheezing, however discussed short course of steroids to see if that helps her recover.  Continue Symbicort, and as needed albuterol.  This is her third course of steroids over the last 6 months she has not seen pulmonology recently.  Referral placed to pulm for reassessment to see if her controller medication should be adjusted. ? ?  ?  ? Relevant Medications  ? predniSONE (DELTASONE) 20 MG tablet  ? montelukast (SINGULAIR) 10 MG tablet  ? Other Relevant Orders  ? Ambulatory referral to Pulmonology  ?  ? Other  ? Depression, major, single episode, complete remission (  HCC)  ?  Symptoms significantly improved, and PHQ 9 low today.  Continue Zoloft 150 mg daily.  Continue therapy ? ?  ?  ? ? ? ?Return in about 10 months (around 08/23/2022) for annual. ? ?Lynnda Child, MD ? ? ? ?

## 2021-10-21 NOTE — Assessment & Plan Note (Signed)
Symptoms significantly improved, and PHQ 9 low today.  Continue Zoloft 150 mg daily.  Continue therapy ?

## 2021-10-21 NOTE — Assessment & Plan Note (Signed)
Per prior history symptoms sound consistent with possible mild exacerbation.  Lungs without significant wheezing, however discussed short course of steroids to see if that helps her recover.  Continue Symbicort, and as needed albuterol.  This is her third course of steroids over the last 6 months she has not seen pulmonology recently.  Referral placed to pulm for reassessment to see if her controller medication should be adjusted. ?

## 2021-10-21 NOTE — Patient Instructions (Addendum)
Anxiety and depression ?- glad things are better! ?- continue medication ? ?Asthma ?- course of steroids ? ?#Referral ?I have placed a referral to a specialist for you. You should receive a phone call from the specialty office. Make sure your voicemail is not full and that if you are able to answer your phone to unknown or new numbers.  ? ?It may take up to 2 weeks to hear about the referral. If you do not hear anything in 2 weeks, please call our office and ask to speak with the referral coordinator.  ? ?

## 2021-10-21 NOTE — Assessment & Plan Note (Addendum)
Stable on Singulair and as needed Allegra.  Refill Singulair ?

## 2021-10-25 ENCOUNTER — Encounter: Payer: Self-pay | Admitting: Emergency Medicine

## 2021-10-25 ENCOUNTER — Ambulatory Visit (INDEPENDENT_AMBULATORY_CARE_PROVIDER_SITE_OTHER): Payer: Managed Care, Other (non HMO)

## 2021-10-25 ENCOUNTER — Ambulatory Visit
Admission: EM | Admit: 2021-10-25 | Discharge: 2021-10-25 | Disposition: A | Discharge status: Home / Self Care | Payer: Managed Care, Other (non HMO) | Attending: Emergency Medicine | Admitting: Emergency Medicine

## 2021-10-25 DIAGNOSIS — R0602 Shortness of breath: Secondary | ICD-10-CM

## 2021-10-25 DIAGNOSIS — J209 Acute bronchitis, unspecified: Secondary | ICD-10-CM

## 2021-10-25 MED ORDER — IPRATROPIUM-ALBUTEROL 20-100 MCG/ACT IN AERS: 1.0000 | INHALATION_SPRAY | Freq: Four times a day (QID) | RESPIRATORY_TRACT | 1 refills | Status: DC

## 2021-10-25 MED ORDER — AZITHROMYCIN 250 MG PO TABS: 250.0000 mg | ORAL_TABLET | Freq: Every day | ORAL | 0 refills | Status: DC

## 2021-10-25 NOTE — Discharge Instructions (Addendum)
Your chest x-ray showed asthma versus bronchitis which both caused inflammation to the upper airways ? ?Begin use of azithromycin as directed, this medication will cover for bacteria ? ?We can avoid use of steroids as you recently have completed a dose, your rescue inhaler has been switched to ipratropium-albuterol which is an additional bronchodilator to help further calm your symptoms, you may use every 4-6 hours ? ?Please continue all of your normal medication as prescribed ? ?May follow-up with urgent care or your primary care doctor as needed ? ?

## 2021-10-25 NOTE — ED Provider Notes (Signed)
?[facility] ? ? ? ?CSN: [id number] ?Arrival date & time: 10/25/21  1645 ? ? ?  ? ?History   ?Chief Complaint ?Chief Complaint  ?Patient presents with  ? Wheezing  ?  Asthma flare up ongoing. Started on prednisone 4/21. Increased albuterol nebs. Cont with chest tightness, cough and congestion. Wheezing better. T98.5. O2 96% HR94-120s. - Entered by patient  ? ? ?HPI ?Meredith Campbell is a 36 y.o. female.  ? ?Patient presents with a  nonproductive cough, chest tightness, wheezing present for 1-1/2 weeks.  Initially symptoms accompany a viral upper respiratory infection but the above symptoms have persisted.  Was evaluated by her PCP 5 days ago, placed on prednisone course, endorses that wheezing ha has improved but chest tightness has persisted but not worsened.  Associated shortness of breath with exertion.  History of asthma, seasonal allergies, currently taking an antihistamine, Singulair, albuterol, Symbicort.  Has increased use of nebulizer inhaler, taking every 4 hours using double the dosage. ? ?Past Medical History:  ?Diagnosis Date  ? Asthma   ? Chicken pox   ? Depression   ? GERD (gastroesophageal reflux disease)   ? Hay fever   ? Seasonal allergies   ? ? ?Patient Active Problem List  ? Diagnosis Date Noted  ? COVID-19 07/20/2021  ? Wheezing 07/20/2021  ? Other chest pain 07/20/2021  ? Palpitations 07/20/2021  ? Abnormal finding on EKG 07/20/2021  ? Vitamin D deficiency 05/25/2020  ? Depression, major, single episode, complete remission (HCC) 05/25/2020  ? Insomnia 05/25/2020  ? Other fatigue 05/25/2020  ? Morbid obesity (HCC) 05/25/2020  ? Generalized anxiety disorder 01/24/2019  ? Chronic allergic rhinitis 03/28/2018  ? Mild persistent asthma 03/28/2018  ? ? ?Past Surgical History:  ?Procedure Laterality Date  ? EAR CYST EXCISION    ? ? ?OB History   ?No obstetric history on file. ?  ? ? ? ?Home Medications   ? ?Prior to Admission medications   ?Medication Sig Start Date End Date Taking?  Authorizing Provider  ?albuterol (PROVENTIL) (2.5 MG/3ML) 0.083% nebulizer solution Take 3 mLs (2.5 mg total) by nebulization every 6 (six) hours as needed for wheezing or shortness of breath. 08/18/21  Yes Lynnda Child, MD  ?albuterol (VENTOLIN HFA) 108 (90 Base) MCG/ACT inhaler Inhale 2 puffs into the lungs every 6 (six) hours as needed for wheezing or shortness of breath. 08/18/21  Yes Lynnda Child, MD  ?budesonide-formoterol (SYMBICORT) 160-4.5 MCG/ACT inhaler INHALE 2 PUFFS INTO THE LUNGS TWICE DAILY. 08/18/21  Yes Lynnda Child, MD  ?cetirizine (ZYRTEC) 10 MG tablet Take 10 mg by mouth daily.   Yes [provider]  ?fexofenadine (ALLEGRA) 180 MG tablet Take 180 mg by mouth as needed. 07/03/16  Yes [provider]  ?montelukast (SINGULAIR) 10 MG tablet Take 1 tablet (10 mg total) by mouth at bedtime. 10/21/21  Yes Lynnda Child, MD  ?Multiple Vitamin (MULTIVITAMIN) tablet Take 1 tablet by mouth 2 (two) times daily.   Yes [provider]  ?predniSONE (DELTASONE) 20 MG tablet Take 2 tablets (40 mg total) by mouth daily with breakfast for 5 days. 10/21/21 10/26/21 Yes Lynnda Child, MD  ?sertraline (ZOLOFT) 100 MG tablet Take 1.5 tablets (150 mg total) by mouth daily. 08/18/21  Yes Lynnda Child, MD  ? ? ?Family History ?Family History  ?Problem Relation Age of Onset  ? Diabetes Mother   ? Hypertension Mother   ? Hyperlipidemia Mother   ? Kidney disease Paternal Uncle   ?  Diabetes Maternal Grandmother   ? Hypertension Maternal Grandmother   ? Stroke Paternal Grandmother   ? Stroke Maternal Grandfather   ? ? ?Social History ?Social History  ? ?Tobacco Use  ? Smoking status: Never  ? Smokeless tobacco: Never  ?Vaping Use  ? Vaping Use: Never used  ?Substance Use Topics  ? Alcohol use: Yes  ?  Comment: less than once a year  ? Drug use: No  ? ? ? ?Allergies   ?Patient has no known allergies. ? ? ?Review of Systems ?Review of Systems  ?Constitutional: Negative.   ?HENT: Negative.     ?Respiratory:  Positive for cough, chest tightness, shortness of breath and wheezing. Negative for apnea, choking and stridor.   ?Cardiovascular: Negative.   ?Skin: Negative.   ? ? ?Physical Exam ?Triage Vital Signs ?ED Triage Vitals  ?Enc Vitals Group  ?   BP 10/25/21 1701 138/85  ?   Pulse Rate 10/25/21 1701 77  ?   Resp 10/25/21 1701 18  ?   Temp 10/25/21 1701 98.5 ?F (36.9 ?C)  ?   Temp Source 10/25/21 1701 Oral  ?   SpO2 10/25/21 1701 96 %  ?   Weight --   ?   Height --   ?   Head Circumference --   ?   Peak Flow --   ?   Pain Score 10/25/21 1659 7  ?   Pain Loc --   ?   Pain Edu? --   ?   Excl. in GC? --   ? ?No data found. ? ?Updated Vital Signs ?BP 138/85 (BP Location: Left Arm)   Pulse 77   Temp 98.5 ?F (36.9 ?C) (Oral)   Resp 18   LMP 10/15/2021 (Exact Date)   SpO2 96%  ? ?Visual Acuity ?Right Eye Distance:   ?Left Eye Distance:   ?Bilateral Distance:   ? ?Right Eye Near:   ?Left Eye Near:    ?Bilateral Near:    ? ?Physical Exam ?Constitutional:   ?   Appearance: Normal appearance.  ?Eyes:  ?   Extraocular Movements: Extraocular movements intact.  ?Cardiovascular:  ?   Rate and Rhythm: Normal rate and regular rhythm.  ?   Pulses: Normal pulses.  ?   Heart sounds: Normal heart sounds.  ?Pulmonary:  ?   Effort: Pulmonary effort is normal.  ?   Breath sounds: Normal breath sounds.  ?Skin: ?   General: Skin is warm and dry.  ?Neurological:  ?   Mental Status: She is alert and oriented to person, place, and time. Mental status is at baseline.  ?Psychiatric:     ?   Mood and Affect: Mood normal.     ?   Behavior: Behavior normal.  ? ? ? ?UC Treatments / Results  ?Labs ?(all labs ordered are listed, but only abnormal results are displayed) ?Labs Reviewed - No data to display ? ?EKG ? ? ?Radiology ?No results found. ? ?Procedures ?Procedures (including critical care time) ? ?Medications Ordered in UC ?Medications - No data to display ? ?Initial Impression / Assessment and Plan / UC Course  ?I have reviewed  the triage vital signs and the nursing notes. ? ?Pertinent labs & imaging results that were available during my care of the patient were reviewed by me and considered in my medical decision making (see chart for details). ? ?Acute bronchitis ? ?Confirmed via x-ray, as patient just completed course of prednisone today will not refill, Z-Pak prescribed  in addition, albuterol inhaler changed to ipratropium albuterol ideally for better results, may continue use of daily antihistamines, Flonase, Singulair, Symbicort, recommended follow-up with urgent care or PCP if symptoms continue to persist, recommended follow-up with pulmonology, referral placed by PCP ?Final Clinical Impressions(s) / UC Diagnoses  ? ?Final diagnoses:  ?None  ? ?Discharge Instructions   ?None ?  ? ?ED Prescriptions   ?None ?  ? ?PDMP not reviewed this encounter. ?  ?Valinda HoarWhite, Brandi Armato R, NP ?10/25/21 1801 ? ?

## 2021-10-25 NOTE — ED Triage Notes (Signed)
Pt presents with cough, wheezing and chest tightness x 1 week. Pt was seen by her PC on 4/21 and prescribed prednisone. She has been taking Singulair, Zyrtec, and Allegra. She has also been using her neb treatment and Symbicort inhaler, but her symptoms are getting worse.  ?

## 2021-10-27 ENCOUNTER — Encounter: Payer: Self-pay | Admitting: Family Medicine

## 2021-11-15 NOTE — Telephone Encounter (Signed)
Pt is scheduled 11/23/21 with [facility] ?

## 2021-11-23 ENCOUNTER — Encounter: Payer: Self-pay | Admitting: Internal Medicine

## 2021-11-23 ENCOUNTER — Ambulatory Visit (INDEPENDENT_AMBULATORY_CARE_PROVIDER_SITE_OTHER): Payer: Managed Care, Other (non HMO) | Admitting: Internal Medicine

## 2021-11-23 VITALS — BP 128/76 | HR 85 | Ht 61.5 in | Wt 229.4 lb

## 2021-11-23 DIAGNOSIS — J4540 Moderate persistent asthma, uncomplicated: Secondary | ICD-10-CM | POA: Diagnosis not present

## 2021-11-23 DIAGNOSIS — J4530 Mild persistent asthma, uncomplicated: Secondary | ICD-10-CM

## 2021-11-23 LAB — NITRIC OXIDE: Nitric Oxide: 13

## 2021-11-23 MED ORDER — DULERA 200-5 MCG/ACT IN AERO: 2.0000 | INHALATION_SPRAY | Freq: Two times a day (BID) | RESPIRATORY_TRACT | 5 refills | Status: DC

## 2021-11-23 NOTE — Progress Notes (Signed)
Meredith Campbell    [id number]    July 29, 1985  Primary Care Physician:Meredith Campbell, Meredith Heck, MD  Referring Physician: Lynnda Child, MD [address] [place],  [place] [address] Reason for Consultation: asthma Date of Consultation: 11/23/2021  Chief complaint:   Chief Complaint  Patient presents with   Consult    Referred by PCP for asthma. Per patient, she has had multiple flares up since November 2022. Had COVID back in January 2023.      [id number] Meredith Campbell is a 36 y.o. woman who presents for new patient evaluation for asthma.  She had a flu like illness in Nov 2022 and has had two flares of asthma in the last 6 months. She was seen in urgent care a month ago for bronchitis.   Symptoms have worsened in the last 6 months, but she has had asthma since she was a Campbell. Also had eczema and allergies. Twin sister had allergies but not asthma.  She sees [facility] [place] now, no longer on SCIT  Had a flare when she was living in [place] and her allergist gave her epi injections for asthma attack in the office.   Has been on advair in the past but has been on symbicort more recently. Has not had PFTs in 10 years.   Has dyspnea and coughing which is worse at night and improves with combivent and symbicort. Also has to take combivent first thing in the morning.  Has HEPA air filters at home. Doesn't use hypoallergenic bedding. Has wooden floors at home which has helped.    Current Regimen: symbicort 160 two puffs twice a day, combivent 1 puff BID, singulair, anti-histamines, no albuterol use outside that.  Asthma Triggers: seasonal environmental allergies, URIs, heat Exacerbations in the last year: 2-3 requiring prednisone History of hospitalization or intubation: never hospitalized Allergy Testing: yes has allergy to fresh fruits. Has also been on SCIT.  GERD: occasionally, not daily. Only with certain foods.  Allergic Rhinitis: yes takes zyrtec, allegra and  singulair, Stopped taking flonase because symptoms are controlled on this.  ACT:  Asthma Control Test ACT Total Score  11/23/2021  9:46 AM 13  FeNO: 13 ppb  Social history:  Occupation: works as a Development worker, community - internal medicine works in [place] Exposures: from [place]  Symptoms were worse in [place].  Smoking history: never smoker, no passive smoke exposure.   Social History   Occupational History   Not on file  Tobacco Use   Smoking status: Never   Smokeless tobacco: Never  Vaping Use   Vaping Use: Never used  Substance and Sexual Activity   Alcohol use: Yes    Comment: less than once a year   Drug use: No   Sexual activity: Never    Birth control/protection: Abstinence    Relevant family history:  Family History  Problem Relation Age of Onset   Diabetes Mother    Hypertension Mother    Hyperlipidemia Mother    Environmental Allergies Sister    Diabetes Maternal Grandmother    Hypertension Maternal Grandmother    Stroke Maternal Grandfather    Stroke Paternal Grandmother    Kidney disease Paternal Uncle     Past Medical History:  Diagnosis Date   Asthma    Chicken pox    Depression    GERD (gastroesophageal reflux disease)    Hay fever    Seasonal allergies  Past Surgical History:  Procedure Laterality Date   EAR CYST EXCISION      Physical Exam: Blood pressure 128/76, pulse 85, height 5' 1.5" (1.562 m), weight 229 lb 6.4 oz (104.1 kg), SpO2 99 %. Gen:      No acute distress ENT:  mallampati IV, no nasal polyps, mucus membranes moist Lungs:    No increased respiratory effort, symmetric chest wall excursion, clear to auscultation bilaterally, no wheezes or crackles CV:         Regular rate and rhythm; no murmurs, rubs, or gallops.  No pedal edema Abd:      + bowel sounds; soft, non-tender; no distension MSK: no acute synovitis of DIP or PIP joints, no mechanics hands.  Skin:      Warm and dry; no  rashes Neuro: normal speech, no focal facial asymmetry Psych: alert and oriented x3, normal mood and affect   Data Reviewed/Medical Decision Making:  Independent interpretation of tests: Imaging:  Review of patient's chest xray images October 25 2021 revealed no acute process. The patient's images have been independently reviewed by me.    PFTs: I have personally reviewed the patient's PFTs and      View : No data to display.          Labs:  She had blood work done in November 2022 which I was able to review through her patient portal on her phone.  B12 and vitamin D low.  CBC and CMP were wnl per her.   Immunization status:  Immunization History  Administered Date(s) Administered   Influenza,inj,Quad PF,6+ Mos 04/19/2020   PFIZER(Purple Top)SARS-COV-2 Vaccination 07/21/2019, 08/11/2019, 07/13/2020   Tdap 11/11/2013     I reviewed prior external note(s) from urgent care, PCP  I reviewed the result(s) of the labs and imaging as noted above.   I have ordered spirometry/feno  Assessment:  Moderate Persistent Asthma, worsening control Food allergies Seasonal/environmental allergies.    Plan/Recommendations: Will obtain spiro and feno today - feno 13 ppb and spirometry was nl.  Will stop symbicort and increase to medium dose ICS-LABA with dulera 200.   Continue prn combivent/albuterol. If not improving with this intervention would try high dose ICS and consider initiation of biologic therapy.   Will obtain records from previous allergist in [place] where she had testing and blood work done. Otherwise will need ige with region 2 allergy panel, absolute eos count  Return to Care: Return in about 3 months (around 02/23/2022).  Durel Salts, MD Pulmonary and Critical Care Medicine  HealthCare Office:[phone]  CC: Meredith Child, MD

## 2021-11-23 NOTE — Patient Instructions (Addendum)
Please schedule follow up scheduled with myself in 3 months.  If my schedule is not open yet, we will contact you with a reminder closer to that time. Please call [phone] if you haven't heard from us a month before.   Stop symbicort. Switch to dulera 200 2 puffs twice a day.  We will get your allergy testing records from [place].  Call me sooner if this is not working or improving control after a couple weeks of use.   Continue allergy meds as you are already doing.   By learning about asthma and how it can be controlled, you take an important step toward managing this disease. Work closely with your asthma care team to learn all you can about your asthma, how to avoid triggers, what your medications do, and how to take them correctly. With proper care, you can live free of asthma symptoms and maintain a normal, healthy lifestyle.   What is asthma? Asthma is a chronic disease that affects the airways of the lungs. During normal breathing, the bands of muscle that surround the airways are relaxed and air moves freely. During an asthma episode or "attack," there are three main changes that stop air from moving easily through the airways: The bands of muscle that surround the airways tighten and make the airways narrow. This tightening is called bronchospasm.  The lining of the airways becomes swollen or inflamed.  The cells that line the airways produce more mucus, which is thicker than normal and clogs the airways.  These three factors - bronchospasm, inflammation, and mucus production - cause symptoms such as difficulty breathing, wheezing, and coughing.  What are the most common symptoms of asthma? Asthma symptoms are not the same for everyone. They can even change from episode to episode in the same person. Also, you may have only one symptom of asthma, such as cough, but another person may have all the symptoms of asthma. It is important to know all the symptoms of asthma and to be  aware that your asthma can present in any of these ways at any time. The most common symptoms include: Coughing, especially at night  Shortness of breath  Wheezing  Chest tightness, pain, or pressure   Who is affected by asthma? Asthma affects 22 million Americans; about 6 million of these are children under age 38. People who have a family history of asthma have an increased risk of developing the disease. Asthma is also more common in people who have allergies or who are exposed to tobacco smoke. However, anyone can develop asthma at any time. Some people may have asthma all of their lives, while others may develop it as adults.  What causes asthma? The airways in a person with asthma are very sensitive and react to many things, or "triggers." Contact with these triggers causes asthma symptoms. One of the most important parts of asthma control is to identify your triggers and then avoid them when possible. The only trigger you do not want to avoid is exercise. Pre-treatment with medicines before exercise can allow you to stay active yet avoid asthma symptoms. Common asthma triggers include: Infections (colds, viruses, flu, sinus infections)  Exercise  Weather (changes in temperature and/or humidity, cold air)  Tobacco smoke  Allergens (dust mites, pollens, pets, mold spores, cockroaches, and sometimes foods)  Irritants (strong odors from cleaning products, perfume, wood smoke, air pollution)  Strong emotions such as crying or laughing hard  Some medications   How is asthma diagnosed?  To diagnose asthma, your doctor will first review your medical history, family history, and symptoms. Your doctor will want to know any past history of breathing problems you may have had, as well as a family history of asthma, allergies, eczema (a bumpy, itchy skin rash caused by allergies), or other lung disease. It is important that you describe your symptoms in detail (cough, wheeze, shortness of breath,  chest tightness), including when and how often they occur. The doctor will perform a physical examination and listen to your heart and lungs. He or she may also order breathing tests, allergy tests, blood tests, and chest and sinus X-rays. The tests will find out if you do have asthma and if there are any other conditions that are contributing factors.  How is asthma treated? Asthma can be controlled, but not cured. It is not normal to have frequent symptoms, trouble sleeping, or trouble completing tasks. Appropriate asthma care will prevent symptoms and visits to the emergency room and hospital. Asthma medicines are one of the mainstays of asthma treatment. The drugs used to treat asthma are explained below.  Anti-inflammatories: These are the most important drugs for most people with asthma. Anti-inflammatory drugs reduce swelling and mucus production in the airways. As a result, airways are less sensitive and less likely to react to triggers. These medications need to be taken daily and may need to be taken for several weeks before they begin to control asthma. Anti-inflammatory medicines lead to fewer symptoms, better airflow, less sensitive airways, less airway damage, and fewer asthma attacks. If taken every day, they CONTROL or prevent asthma symptoms.   Bronchodilators: These drugs relax the muscle bands that tighten around the airways. This action opens the airways, letting more air in and out of the lungs and improving breathing. Bronchodilators also help clear mucus from the lungs. As the airways open, the mucus moves more freely and can be coughed out more easily. In short-acting forms, bronchodilators RELIEVE or stop asthma symptoms by quickly opening the airways and are very helpful during an asthma episode. In long-acting forms, bronchodilators provide CONTROL of asthma symptoms and prevent asthma episodes.  Asthma drugs can be taken in a variety of ways. Inhaling the medications by using a  metered dose inhaler, dry powder inhaler, or nebulizer is one way of taking asthma medicines. Oral medicines (pills or liquids you swallow) may also be prescribed.  Asthma severity Asthma is classified as either "intermittent" (comes and goes) or "persistent" (lasting). Persistent asthma is further described as being mild, moderate, or severe. The severity of asthma is based on how often you have symptoms both during the day and night, as well as by the results of lung function tests and by how well you can perform activities. The "severity" of asthma refers to how "intense" or "strong" your asthma is.  Asthma control Asthma control is the goal of asthma treatment. Regardless of your asthma severity, it may or may not be controlled. Asthma control means: You are able to do everything you want to do at work and home  You have no (or minimal) asthma symptoms  You do not wake up from your sleep or earlier than usual in the morning due to asthma  You rarely need to use your reliever medicine (inhaler)  Another major part of your treatment is that you are happy with your asthma care and believe your asthma is controlled.  Monitoring symptoms A key part of treatment is keeping track of how well your lungs  are working. Monitoring your symptoms  what they are, how and when they happen, and how severe they are  is an important part of being able to control your asthma.  Sometimes asthma is monitored using a peak flow meter. A peak flow (PF) meter measures how fast the air comes out of your lungs. It can help you know when your asthma is getting worse, sometimes even before you have symptoms. By taking daily peak flow readings, you can learn when to adjust medications to keep asthma under good control. It is also used to create your asthma action plan (see below). Your doctor can use your peak flow readings to adjust your treatment plan in some cases.  Asthma Action Plan Based on your history and  asthma severity, you and your doctor will develop a care plan called an "asthma action plan." The asthma action plan describes when and how to use your medicines, actions to take when asthma worsens, and when to seek emergency care. Make sure you understand this plan. If you do not, ask your asthma care provider any questions you may have. Your asthma action plan is one of the keys to controlling asthma. Keep it readily available to remind you of what you need to do every day to control asthma and what you need to do when symptoms occur.  Goals of asthma therapy These are the goals of asthma treatment: Live an active, normal life  Prevent chronic and troublesome symptoms  Attend work or school every day  Perform daily activities without difficulty  Stop urgent visits to the doctor, emergency department, or hospital  Use and adjust medications to control asthma with few or no side effects

## 2022-02-03 ENCOUNTER — Other Ambulatory Visit: Payer: Self-pay | Admitting: Family Medicine

## 2022-02-03 DIAGNOSIS — F331 Major depressive disorder, recurrent, moderate: Secondary | ICD-10-CM

## 2022-02-03 DIAGNOSIS — F411 Generalized anxiety disorder: Secondary | ICD-10-CM

## 2022-02-23 ENCOUNTER — Encounter: Payer: Self-pay | Admitting: Internal Medicine

## 2022-02-23 ENCOUNTER — Ambulatory Visit (INDEPENDENT_AMBULATORY_CARE_PROVIDER_SITE_OTHER): Payer: Managed Care, Other (non HMO) | Admitting: Internal Medicine

## 2022-02-23 VITALS — BP 120/80 | HR 88 | Ht 61.0 in | Wt 240.8 lb

## 2022-02-23 DIAGNOSIS — J301 Allergic rhinitis due to pollen: Secondary | ICD-10-CM | POA: Diagnosis not present

## 2022-02-23 DIAGNOSIS — J4540 Moderate persistent asthma, uncomplicated: Secondary | ICD-10-CM | POA: Diagnosis not present

## 2022-02-23 NOTE — Patient Instructions (Signed)
Please schedule follow up scheduled with myself in 6 months.  If my schedule is not open yet, we will contact you with a reminder closer to that time. Please call [phone] if you haven't heard from [place] a month before.   Continue the dulera inhaler 2 puffs twice a day, gargle after use. Continue atrovent or albuterol inhaler as needed in between  Continue your anti-histamines for the allergy symptoms which could trigger your asthma.  Movement is medicine! Start small - baby steps are still progress in the right direction. Just walking 10-15 minutes a couple times a week intentionally can be a good place to start.  Come see me sooner if you need me.

## 2022-02-23 NOTE — Progress Notes (Signed)
Meredith Campbell    [id number]    06-01-1986  Primary Care Physician:Cody, Chryl Heck, MD Date of Appointment: 02/23/2022 Established Patient Visit  Chief complaint:   Chief Complaint  Patient presents with   Follow-up    Follow-up     HPI: Meredith Campbell  is a 36 y.o. with asthma.  Interval Updates:  Here for follow up after an increase in ICS with dulera.  Feeling much better.  Is trying to get into a more regular activity routine. Looking at getting home trainer and turning her garage into a work out space.  Minimal albuterol use. No steroids.  Looked through her old records and did not see an IgE level.   Current Regimen: dulera 200 2 puffs twice a day. Albuterol prn combivent 1 puff BID, singulair, anti-histamines, no albuterol use outside that.  Asthma Triggers: seasonal environmental allergies, URIs, heat Exacerbations in the last year: 2-3 requiring prednisone History of hospitalization or intubation: never hospitalized Allergy Testing: yes has allergy to fresh fruits. Has also been on SCIT.  GERD: occasionally, not daily. Only with certain foods.  Allergic Rhinitis: yes takes zyrtec, allegra and singulair,  ACT:  Asthma Control Test ACT Total Score  02/23/2022 10:26 AM 20  11/23/2021  9:46 AM 13   FeNO: 13 ppb  I have reviewed the patient's family social and past medical history and updated as appropriate.   Past Medical History:  Diagnosis Date   Asthma    Chicken pox    Depression    GERD (gastroesophageal reflux disease)    Hay fever    Seasonal allergies     Past Surgical History:  Procedure Laterality Date   EAR CYST EXCISION      Family History  Problem Relation Age of Onset   Diabetes Mother    Hypertension Mother    Hyperlipidemia Mother    Environmental Allergies Sister    Diabetes Maternal Grandmother    Hypertension Maternal Grandmother    Stroke Maternal Grandfather    Stroke Paternal Grandmother    Kidney  disease Paternal Uncle     Social History   Occupational History   Not on file  Tobacco Use   Smoking status: Never   Smokeless tobacco: Never  Vaping Use   Vaping Use: Never used  Substance and Sexual Activity   Alcohol use: Yes    Comment: less than once a year   Drug use: No   Sexual activity: Never    Birth control/protection: Abstinence     Physical Exam: Blood pressure 120/80, pulse 88, height 5\' 1"  (1.549 m), weight 240 lb 12.8 oz (109.2 kg), SpO2 100 %.  Gen:      No acute distress ENT:  no nasal polyps, mucus membranes moist Lungs:    No increased respiratory effort, symmetric chest wall excursion, clear to auscultation bilaterally, no wheezes or crackles CV:         Regular rate and rhythm; no murmurs, rubs, or gallops.  No pedal edema   Data Reviewed: Imaging:   PFTs: Normal spirometry May 2023  Labs: Lab Results  Component Value Date   WBC 6.1 05/25/2020   HGB 13.4 05/25/2020   HCT 38.6 05/25/2020   MCV 87.1 05/25/2020   PLT 247.0 05/25/2020   Lab Results  Component Value Date   NA 138 05/25/2020   K 3.8 05/25/2020   CL 101 05/25/2020   CO2 30 05/25/2020    Immunization status: Immunization  History  Administered Date(s) Administered   Influenza, High Dose Seasonal PF 05/03/2018, 08/13/2019   Influenza,inj,Quad PF,6+ Mos 04/19/2020   Influenza-Unspecified 04/03/2021   PFIZER(Purple Top)SARS-COV-2 Vaccination 07/21/2019, 08/11/2019, 03/26/2020, 07/13/2020   Tdap 11/11/2013    External Records Personally Reviewed:   Assessment:  Moderate persistent asthma, improved control Allergic rhinitis, controlled  Plan/Recommendations:  Continue the dulera inhaler 2 puffs twice a day, gargle after use. Continue atrovent or albuterol inhaler as needed in between  Continue your anti-histamines for the allergy symptoms which could trigger your asthma.  Movement is medicine! Start small - baby steps are still progress in the right direction. Just  walking 10-15 minutes a couple times a week intentionally can be a good place to start.  Come see me sooner if you need me.   Return to Care: Return in about 6 months (around 08/26/2022).   Durel Salts, MD Pulmonary and Critical Care Medicine [facility] Office:[phone]

## 2022-03-28 ENCOUNTER — Other Ambulatory Visit: Payer: Self-pay | Admitting: Internal Medicine

## 2022-05-03 ENCOUNTER — Telehealth: Payer: Self-pay

## 2022-05-03 DIAGNOSIS — F331 Major depressive disorder, recurrent, moderate: Secondary | ICD-10-CM

## 2022-05-03 DIAGNOSIS — F411 Generalized anxiety disorder: Secondary | ICD-10-CM

## 2022-05-03 MED ORDER — SERTRALINE HCL 100 MG PO TABS: ORAL_TABLET | ORAL | 1 refills | Status: DC

## 2022-05-03 NOTE — Telephone Encounter (Signed)
Please send mychart message to pt since I won't be here to follow-up.

## 2022-05-03 NOTE — Telephone Encounter (Signed)
Patient has CPE with Einar Pheasant still scheduled for 08/23/2022. Please call patient to change to toc

## 2022-05-03 NOTE — Telephone Encounter (Signed)
Called patient and mailbox is full

## 2022-05-03 NOTE — Telephone Encounter (Signed)
Pt needs a TOC and then a physical scheduled in February.

## 2022-07-19 ENCOUNTER — Ambulatory Visit: Payer: Managed Care, Other (non HMO) | Admitting: Family Medicine

## 2022-08-23 ENCOUNTER — Encounter: Payer: Managed Care, Other (non HMO) | Admitting: Family Medicine

## 2022-09-07 ENCOUNTER — Encounter: Payer: Self-pay | Admitting: Family

## 2022-09-07 ENCOUNTER — Ambulatory Visit (INDEPENDENT_AMBULATORY_CARE_PROVIDER_SITE_OTHER): Payer: Managed Care, Other (non HMO) | Admitting: Family

## 2022-09-07 VITALS — BP 130/86 | HR 92 | Temp 97.6°F | Ht 61.0 in | Wt 228.2 lb

## 2022-09-07 DIAGNOSIS — F4323 Adjustment disorder with mixed anxiety and depressed mood: Secondary | ICD-10-CM | POA: Insufficient documentation

## 2022-09-07 DIAGNOSIS — F411 Generalized anxiety disorder: Secondary | ICD-10-CM

## 2022-09-07 DIAGNOSIS — F325 Major depressive disorder, single episode, in full remission: Secondary | ICD-10-CM

## 2022-09-07 MED ORDER — SERTRALINE HCL 100 MG PO TABS: ORAL_TABLET | ORAL | 3 refills | Status: DC

## 2022-09-07 NOTE — Assessment & Plan Note (Signed)
Increase sertraline to 200 mg once daily  See therapist as scheduled.  No need for psychiatrist for now.  F/u one month with me. FMLA form will be completed once received.

## 2022-09-07 NOTE — Patient Instructions (Signed)
Increase sertraline to 200 mg once daily, take two of the 100 mg tablets.    Regards,   Kong Packett FNP-C   ------------------------------------  Managing Anxiety, Adult After being diagnosed with anxiety, you may be relieved to know why you have felt or behaved a certain way. You may also feel overwhelmed about the treatment ahead and what it will mean for your life. With care and support, you can manage your anxiety. How to manage lifestyle changes Understanding the difference between stress and anxiety Although stress can play a role in anxiety, it is not the same as anxiety. Stress is your body's reaction to life changes and events, both good and bad. Stress is often caused by something external, such as a deadline, test, or competition. It normally goes away after the event has ended and will last just a few hours. But, stress can be ongoing and can lead to more than just stress. Anxiety is caused by something internal, such as imagining a terrible outcome or worrying that something will go wrong that will greatly upset you. Anxiety often does not go away even after the event is over, and it can become a long-term (chronic) worry. Lowering stress and anxiety Talk with your health care provider or a counselor to learn more about lowering anxiety and stress. They may suggest tension-reduction techniques, such as: Music. Spend time creating or listening to music that you enjoy and that inspires you. Mindfulness-based meditation. Practice being aware of your normal breaths while not trying to control your breathing. It can be done while sitting or walking. Centering prayer. Focus on a word, phrase, or sacred image that means something to you and brings you peace. Deep breathing. Expand your stomach and inhale slowly through your nose. Hold your breath for 3-5 seconds. Then breathe out slowly, letting your stomach muscles relax. Self-talk. Learn to notice and spot thought patterns that lead  to anxiety reactions. Change those patterns to thoughts that feel peaceful. Muscle relaxation. Take time to tense muscles and then relax them. Choose a tension-reduction technique that fits your lifestyle and personality. These techniques take time and practice. Set aside 5-15 minutes a day to do them. Specialized therapists can offer counseling and training in these techniques. The training to help with anxiety may be covered by some insurance plans. Other things you can do to manage stress and anxiety include: Keeping a stress diary. This can help you learn what triggers your reaction and then learn ways to manage your response. Thinking about how you react to certain situations. You may not be able to control everything, but you can control your response. Making time for activities that help you relax and not feeling guilty about spending your time in this way. Doing visual imagery. This involves imagining or creating mental pictures to help you relax. Practicing yoga. Through yoga poses, you can lower tension and relax.  Medicines Medicines for anxiety include: Antidepressant medicines. These are usually prescribed for long-term daily control. Anti-anxiety medicines. These may be added in severe cases, especially when panic attacks occur. When used together, medicines, psychotherapy, and tension-reduction techniques may be the most effective treatment. Relationships Relationships can play a big part in helping you recover. Spend more time connecting with trusted friends and family members. Think about going to couples counseling if you have a partner, taking family education classes, or going to family therapy. Therapy can help you and others better understand your anxiety. How to recognize changes in your anxiety Everyone responds differently  to treatment for anxiety. Recovery from anxiety happens when symptoms lessen and stop interfering with your daily life at home or work. This may mean  that you will start to: Have better concentration and focus. Worry will interfere less in your daily thinking. Sleep better. Be less irritable. Have more energy. Have improved memory. Try to recognize when your condition is getting worse. Contact your provider if your symptoms interfere with home or work and you feel like your condition is not improving. Follow these instructions at home: Activity Exercise. Adults should: Exercise for at least 150 minutes each week. The exercise should increase your heart rate and make you sweat (moderate-intensity exercise). Do strengthening exercises at least twice a week. Get the right amount and quality of sleep. Most adults need 7-9 hours of sleep each night. Lifestyle  Eat a healthy diet that includes plenty of vegetables, fruits, whole grains, low-fat dairy products, and lean protein. Do not eat a lot of foods that are high in fats, added sugars, or salt (sodium). Make choices that simplify your life. Do not use any products that contain nicotine or tobacco. These products include cigarettes, chewing tobacco, and vaping devices, such as e-cigarettes. If you need help quitting, ask your provider. Avoid caffeine, alcohol, and certain over-the-counter cold medicines. These may make you feel worse. Ask your pharmacist which medicines to avoid. General instructions Take over-the-counter and prescription medicines only as told by your provider. Keep all follow-up visits. This is to make sure you are managing your anxiety well or if you need more support. Where to find support You can get help and support from: Self-help groups. Online and [employer]. A trusted spiritual leader. Couples counseling. Family education classes. Family therapy. Where to find more information You may find that joining a support group helps you deal with your anxiety. The following sources can help you find counselors or support groups near you: [place]: mentalhealthamerica.net Anxiety and Depression Association of [place] (ADAA): adaa.[place] on Mental Illness (NAMI): nami.org Contact a health care provider if: You have a hard time staying focused or finishing tasks. You spend many hours a day feeling worried about everyday life. You are very tired because you cannot stop worrying. You start to have headaches or often feel tense. You have chronic nausea or diarrhea. Get help right away if: Your heart feels like it is racing. You have shortness of breath. You have thoughts of hurting yourself or others. Get help right away if you feel like you may hurt yourself or others, or have thoughts about taking your own life. Go to your nearest emergency room or: Call 911. Call the [place] at [phone] or 988. This is open 24 hours a day. Text the Crisis Text Line at 402-873-0825. This information is not intended to replace advice given to you by your health care provider. Make sure you discuss any questions you have with your health care provider. Document Revised: 03/28/2022 Document Reviewed: 10/10/2020 Elsevier Patient Education  [place].
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
## 2022-09-13 ENCOUNTER — Telehealth: Payer: Self-pay | Admitting: Family

## 2022-09-13 NOTE — Telephone Encounter (Signed)
Patient dropped off document FMLA, to be filled out by provider. Patient requested to send it via Fax within ASAP attn. To Vevelyn Francois. Document is located in providers tray at front office.Please advise at Mobile [phone] (mobile). Patient requested a copy once forms are completed, she can be reached at the number above.

## 2022-09-15 NOTE — Telephone Encounter (Signed)
Form placed in Tabitha's box in her office.

## 2022-09-18 NOTE — Telephone Encounter (Signed)
Spoke with the patient and received the rest of the information. Will fax to the provided number.

## 2022-09-18 NOTE — Telephone Encounter (Signed)
FMLA paperwork complete, however if you could ask pt the name of her therapist, and the phone # if she has it as well as next appt date, and then fill it in on page two before copying and giving to pt that would be much appreciated.   Paperwork in outbox.

## 2022-09-21 ENCOUNTER — Other Ambulatory Visit: Payer: Self-pay | Admitting: Internal Medicine

## 2022-09-27 ENCOUNTER — Telehealth: Payer: Self-pay | Admitting: *Deleted

## 2022-09-27 NOTE — Telephone Encounter (Signed)
Patient needs office visit for further refills.  Last seen in August of 2023.

## 2022-09-27 NOTE — Telephone Encounter (Signed)
Called and spoke with [facility] staff, they state that the [facility] 200/5 is too expensive and would like an alternative.  Please advise what is on the preferred list that is comparable to the [facility].  Thank you.

## 2022-09-28 NOTE — Telephone Encounter (Signed)
Needs BIV for ICS-LABA

## 2022-10-03 ENCOUNTER — Other Ambulatory Visit (HOSPITAL_COMMUNITY): Payer: Self-pay

## 2022-10-03 NOTE — Telephone Encounter (Signed)
Per benefits investigation patient has new insurance as of 04/01.

## 2022-10-05 ENCOUNTER — Other Ambulatory Visit (HOSPITAL_COMMUNITY): Payer: Self-pay

## 2022-10-05 NOTE — Telephone Encounter (Signed)
Left message for patient to call back  

## 2022-10-05 NOTE — Telephone Encounter (Signed)
We do not have the patients new insurance information to proceed with benefits investigation.

## 2022-10-05 NOTE — Telephone Encounter (Signed)
Are we able to determine what ICS-LABA is covered under her new insurance?

## 2022-10-10 NOTE — Telephone Encounter (Signed)
Unable to do PA if patient has no insurance coverage.

## 2022-10-10 NOTE — Telephone Encounter (Signed)
Pt called back and said that she is currently on leave from  her job. States that she has lost her insurance due to this.  At the time pt's inhaler was due, she did have insurance but states that she now does not have insurance.  Routing this info to prior auth team.

## 2022-10-11 ENCOUNTER — Ambulatory Visit (INDEPENDENT_AMBULATORY_CARE_PROVIDER_SITE_OTHER): Payer: Self-pay | Admitting: Family

## 2022-10-11 ENCOUNTER — Encounter: Payer: Self-pay | Admitting: Family

## 2022-10-11 VITALS — BP 122/82 | HR 97 | Temp 97.9°F | Ht 61.0 in | Wt 222.6 lb

## 2022-10-11 DIAGNOSIS — F411 Generalized anxiety disorder: Secondary | ICD-10-CM

## 2022-10-11 DIAGNOSIS — J3081 Allergic rhinitis due to animal (cat) (dog) hair and dander: Secondary | ICD-10-CM | POA: Insufficient documentation

## 2022-10-11 DIAGNOSIS — J301 Allergic rhinitis due to pollen: Secondary | ICD-10-CM | POA: Insufficient documentation

## 2022-10-11 DIAGNOSIS — H1045 Other chronic allergic conjunctivitis: Secondary | ICD-10-CM | POA: Insufficient documentation

## 2022-10-11 DIAGNOSIS — F4323 Adjustment disorder with mixed anxiety and depressed mood: Secondary | ICD-10-CM

## 2022-10-11 MED ORDER — AIRDUO DIGIHALER 232-14 MCG/ACT IN AEPB: 2.0000 | INHALATION_SPRAY | Freq: Two times a day (BID) | RESPIRATORY_TRACT | 11 refills | Status: DC

## 2022-10-11 MED ORDER — HYDROXYZINE PAMOATE 25 MG PO CAPS: 25.0000 mg | ORAL_CAPSULE | Freq: Three times a day (TID) | ORAL | 0 refills | Status: DC | PRN

## 2022-10-11 NOTE — Assessment & Plan Note (Addendum)
Ongoing  slight improvement with increase of sertraline to 200 mg once daily.  Tolerating well.  Pt to continue ongoing f/u with therapist as scheduled.  Trial hydroxyxine to help with anxiety state and sleep.

## 2022-10-11 NOTE — Progress Notes (Signed)
Established Patient Office Visit  Subjective:   Patient ID: Meredith Campbell, female    DOB: 1986/01/20  Age: 37 y.o. MRN: [id number]  CC:  Chief Complaint  Patient presents with   Medical Management of Chronic Issues    HPI: Meredith Campbell is a 37 y.o. female presenting on 10/11/2022 for Medical Management of Chronic Issues   HPI  Anxiety/depression: increased sertraline to 200 mg last visit.  She does state that she has noted some improvement in emotional lability and anxiety. She is meeting with her therapist weekly which has also been helping. She does state it is hard for her to handle a lot of drama surrounding her brothers admittance, but the therapist working with her on working through this.   Her therapist is Ms. Daine Gravel, who is at [facility] in [place].   She has been off of work since March 4th. She is currently on FMLA. Still dealing with stressors such as when her brother is discharged that he wants to live with her and she is having to work on renovations in her home for him to live there. Date to return to work is on the 27th of May. This is still the anticipated goal return date.   Goal for her right now is to start daily walks to help with her anxiety state.  She did take hydroxyxine in the past to help her especially with sleep.      10/11/2022   10:33 AM 09/07/2022    9:17 AM 10/21/2021    9:34 AM  PHQ9 SCORE ONLY  PHQ-9 Total Score 12 21 2       10/11/2022   10:34 AM 09/07/2022    9:18 AM 10/21/2021    9:34 AM 08/18/2021   11:10 AM  GAD 7 : Generalized Anxiety Score  Nervous, Anxious, on Edge 1 3 0 3  Control/stop worrying 2 3 0 1  Worry too much - different things 2 2 0 2  Trouble relaxing 3 3 1 3   Restless 1 2 0 1  Easily annoyed or irritable 2 2 0 0  Afraid - awful might happen 2 3 0 1  Total GAD 7 Score 13 18 1 11   Anxiety Difficulty Very difficult Extremely difficult Not difficult at all Somewhat difficult            ROS: Negative unless specifically indicated above in HPI.   Relevant past medical history reviewed and updated as indicated.   Allergies and medications reviewed and updated.   Current Outpatient Medications:    albuterol (VENTOLIN HFA) 108 (90 Base) MCG/ACT inhaler, Inhale 2 puffs into the lungs every 6 (six) hours as needed for wheezing or shortness of breath., Disp: 18 g, Rfl: 5   cetirizine (ZYRTEC) 10 MG tablet, Take 10 mg by mouth daily., Disp: , Rfl:    fexofenadine (ALLEGRA) 180 MG tablet, Take 180 mg by mouth as needed., Disp: , Rfl:    hydrOXYzine (VISTARIL) 25 MG capsule, Take 1 capsule (25 mg total) by mouth every 8 (eight) hours as needed., Disp: 30 capsule, Rfl: 0   mometasone-formoterol (DULERA) 200-5 MCG/ACT AERO, INHALE 2 PUFFS INTO THE LUNGS IN THE MORNING AND AT BEDTIME., Disp: 1 each, Rfl: 3   montelukast (SINGULAIR) 10 MG tablet, Take 1 tablet (10 mg total) by mouth at bedtime., Disp: 90 tablet, Rfl: 3   sertraline (ZOLOFT) 100 MG tablet, Take two tablets once daily, Disp: 180 tablet, Rfl: 3  No Known Allergies  Objective:  BP 122/82   Pulse 97   Temp 97.9 F (36.6 C) (Temporal)   Ht 5\' 1"  (1.549 m)   Wt 222 lb 9.6 oz (101 kg)   SpO2 98%   BMI 42.06 kg/m    Physical Exam Constitutional:      General: She is not in acute distress.    Appearance: Normal appearance. She is obese. She is not ill-appearing, toxic-appearing or diaphoretic.  HENT:     Head: Normocephalic.  Cardiovascular:     Rate and Rhythm: Normal rate and regular rhythm.  Pulmonary:     Effort: Pulmonary effort is normal.  Musculoskeletal:        General: Normal range of motion.  Neurological:     General: No focal deficit present.     Mental Status: She is alert and oriented to person, place, and time. Mental status is at baseline.  Psychiatric:        Attention and Perception: Attention and perception normal.        Mood and Affect: Mood normal. Mood is not  anxious.        Speech: Speech normal.        Behavior: Behavior normal.        Thought Content: Thought content normal.        Cognition and Memory: Cognition and memory normal.        Judgment: Judgment normal.     Assessment & Plan:  Adjustment reaction with anxiety and depression Assessment & Plan: Ongoing  slight improvement with increase of sertraline to 200 mg once daily.  Tolerating well.  Pt to continue ongoing f/u with therapist as scheduled.  Trial hydroxyxine to help with anxiety state and sleep.    Anxiety state -     hydrOXYzine Pamoate; Take 1 capsule (25 mg total) by mouth every 8 (eight) hours as needed.  Dispense: 30 capsule; Refill: 0      Follow up plan: Return in about 1 month (around 11/10/2022) for f/u FMLA and anxiety .  Mort Sawyers, FNP

## 2022-10-11 NOTE — Telephone Encounter (Signed)
Rx sent Pt aware Nothing further needed. 

## 2022-10-11 NOTE — Telephone Encounter (Signed)
Spoke with the pt  She states would like to try Airduo with Goodrx coupon  Please advise what strength and I am happy to send in  Thanks!

## 2022-10-11 NOTE — Telephone Encounter (Signed)
Without insurance her best bet is probably using a [facility]. Let me know how she wants to proceed.

## 2022-10-11 NOTE — Telephone Encounter (Signed)
Airduo 232 2 puffs twice a day [facility] is about $43.

## 2022-10-12 ENCOUNTER — Encounter: Payer: Self-pay | Admitting: Internal Medicine

## 2022-10-13 MED ORDER — FLUTICASONE-SALMETEROL 232-14 MCG/ACT IN AEPB: 2.0000 | INHALATION_SPRAY | Freq: Two times a day (BID) | RESPIRATORY_TRACT | 5 refills | Status: DC

## 2022-11-12 ENCOUNTER — Other Ambulatory Visit: Payer: Self-pay | Admitting: Family

## 2022-11-12 DIAGNOSIS — F325 Major depressive disorder, single episode, in full remission: Secondary | ICD-10-CM

## 2022-11-12 DIAGNOSIS — F411 Generalized anxiety disorder: Secondary | ICD-10-CM

## 2022-11-17 ENCOUNTER — Ambulatory Visit (INDEPENDENT_AMBULATORY_CARE_PROVIDER_SITE_OTHER): Payer: Managed Care, Other (non HMO) | Admitting: Family

## 2022-11-17 ENCOUNTER — Encounter: Payer: Self-pay | Admitting: Family

## 2022-11-17 VITALS — BP 128/72 | HR 84 | Temp 98.0°F | Ht 61.0 in | Wt 222.4 lb

## 2022-11-17 DIAGNOSIS — J309 Allergic rhinitis, unspecified: Secondary | ICD-10-CM

## 2022-11-17 DIAGNOSIS — F4323 Adjustment disorder with mixed anxiety and depressed mood: Secondary | ICD-10-CM | POA: Diagnosis not present

## 2022-11-17 DIAGNOSIS — J4530 Mild persistent asthma, uncomplicated: Secondary | ICD-10-CM | POA: Diagnosis not present

## 2022-11-17 MED ORDER — MONTELUKAST SODIUM 10 MG PO TABS: 10.0000 mg | ORAL_TABLET | Freq: Every day | ORAL | 3 refills | Status: DC

## 2022-11-17 NOTE — Assessment & Plan Note (Signed)
Continue sertraline 200 mg once daily  Continue f/u with therapist as scheduled weekly.  FMLA return date cleared to return May 27th  Will write for 2 hour a week for appts FMLA and 1-2 hours a month prn  May consider reduced work load, she will let me know.

## 2022-11-17 NOTE — Progress Notes (Signed)
Established Patient Office Visit  Subjective:      CC:  Chief Complaint  Patient presents with   Establish Care    HPI: Meredith Campbell is a 37 y.o. female presenting on 11/17/2022 for Establish Care . Anxiety depression: still doing well on the sertraline 200 mg once daily, and also using hydroxyxine, although rare. Sleep has improved with her working on establishing a routine.   She has been out on FMLA from March 4th 2024 and planning to go back on May 27th 2024. She feels she is ready to try to get back to work and work on her daily routine as well. She is seeing a therapist once a week, and her hours are only from 8-1 so she will need some intermittent leave   Her brother is in rehab currently, and no longer going to be moving in with her he will be moving in with his girlfriend instead. Their relationship has become strained and it has been hurtful for her.        Social history:  Relevant past medical, surgical, family and social history reviewed and updated as indicated. Interim medical history since our last visit reviewed.  Allergies and medications reviewed and updated.  DATA REVIEWED: CHART IN EPIC     ROS: Negative unless specifically indicated above in HPI.    Current Outpatient Medications:    albuterol (VENTOLIN HFA) 108 (90 Base) MCG/ACT inhaler, Inhale 2 puffs into the lungs every 6 (six) hours as needed for wheezing or shortness of breath., Disp: 18 g, Rfl: 5   cetirizine (ZYRTEC) 10 MG tablet, Take 10 mg by mouth daily., Disp: , Rfl:    fexofenadine (ALLEGRA) 180 MG tablet, Take 180 mg by mouth as needed., Disp: , Rfl:    hydrOXYzine (VISTARIL) 25 MG capsule, Take 1 capsule (25 mg total) by mouth every 8 (eight) hours as needed., Disp: 30 capsule, Rfl: 0   mometasone-formoterol (DULERA) 200-5 MCG/ACT AERO, INHALE 2 PUFFS INTO THE LUNGS IN THE MORNING AND AT BEDTIME., Disp: 1 each, Rfl: 3   sertraline (ZOLOFT) 100 MG tablet, Take two tablets  once daily, Disp: 180 tablet, Rfl: 3   montelukast (SINGULAIR) 10 MG tablet, Take 1 tablet (10 mg total) by mouth at bedtime., Disp: 90 tablet, Rfl: 3      Objective:    BP 128/72   Pulse 84   Temp 98 F (36.7 C) (Temporal)   Ht 5\' 1"  (1.549 m)   Wt 222 lb 6.4 oz (100.9 kg)   LMP 11/12/2022   SpO2 97%   BMI 42.02 kg/m   Wt Readings from Last 3 Encounters:  11/17/22 222 lb 6.4 oz (100.9 kg)  10/11/22 222 lb 9.6 oz (101 kg)  09/07/22 228 lb 3.2 oz (103.5 kg)    Physical Exam  Physical Exam Constitutional:      General: not in acute distress.    Appearance: Normal appearance. normal weight. is not ill-appearing, toxic-appearing or diaphoretic.  Cardiovascular:     Rate and Rhythm: Normal rate.  Pulmonary:     Effort: Pulmonary effort is normal.  Musculoskeletal:        General: Normal range of motion.  Neurological:     General: No focal deficit present.     Mental Status: alert and oriented to person, place, and time. Mental status is at baseline.  Psychiatric:        Mood and Affect: Mood normal.        Behavior: Behavior normal.  Thought Content: Thought content normal.        Judgment: Judgment normal.        Assessment & Plan:  Adjustment reaction with anxiety and depression Assessment & Plan: Continue sertraline 200 mg once daily  Continue f/u with therapist as scheduled weekly.  FMLA return date cleared to return May 27th  Will write for 2 hour a week for appts FMLA and 1-2 hours a month prn  May consider reduced work load, she will let me know.    Chronic allergic rhinitis -     Montelukast Sodium; Take 1 tablet (10 mg total) by mouth at bedtime.  Dispense: 90 tablet; Refill: 3  Mild persistent asthma, unspecified whether complicated -     Montelukast Sodium; Take 1 tablet (10 mg total) by mouth at bedtime.  Dispense: 90 tablet; Refill: 3     Return in about 6 months (around 05/20/2023) for f/u anxiety, f/u CPE.  Mort Sawyers, MSN, APRN,  FNP-C [facility]

## 2023-03-09 ENCOUNTER — Telehealth (INDEPENDENT_AMBULATORY_CARE_PROVIDER_SITE_OTHER): Payer: Managed Care, Other (non HMO) | Admitting: Family Medicine

## 2023-03-09 ENCOUNTER — Encounter: Payer: Self-pay | Admitting: Family Medicine

## 2023-03-09 VITALS — BP 140/106 | HR 124 | Temp 100.6°F | Ht 61.0 in | Wt 219.0 lb

## 2023-03-09 DIAGNOSIS — U071 COVID-19: Secondary | ICD-10-CM | POA: Diagnosis not present

## 2023-03-09 MED ORDER — NIRMATRELVIR/RITONAVIR (PAXLOVID)TABLET: 3.0000 | ORAL_TABLET | Freq: Two times a day (BID) | ORAL | 0 refills | Status: AC

## 2023-03-09 MED ORDER — ALBUTEROL SULFATE (2.5 MG/3ML) 0.083% IN NEBU: 2.5000 mg | INHALATION_SOLUTION | Freq: Four times a day (QID) | RESPIRATORY_TRACT | 0 refills | Status: AC | PRN

## 2023-03-09 NOTE — Assessment & Plan Note (Addendum)
Reviewed currently approved antiviral treatments as well as their side effects and risks of use including COVID rebound.  Reviewed expected course of illness, anticipated course of recovery, as well as red flags to suggest COVID pneumonia and/or to seek urgent in-person care. Reviewed CDC isolation/quarantine guidelines.  Encouraged fluids and rest. Reviewed further supportive care measures at home including vit C 500mg  bid, vit D 2000 IU daily, zinc 100mg  daily, tylenol PRN, pepcid 20mg  BID PRN.   Recommend:  Full dose paxlovid Paxlovid drug interactions:  none Discussed [email or web address] discount plan through website if needed.   Tachycardia present even prior to albuterol.  Reviewed in detail red flags to seek ER care including persistent fever, dyspnea, tachycardia or chest pain.  I did ask her to update [place] over weekend with how she's doing.

## 2023-03-09 NOTE — Progress Notes (Signed)
Ph: [phone] Fax: [phone]   Patient ID: Meredith Campbell, female    DOB: 1985/10/08, 37 y.o.   MRN: [id number]  Virtual visit completed through MyChart, a video enabled telemedicine application. Due to national recommendations of social distancing due to COVID-19, a virtual visit is felt to be most appropriate for this patient at this time. Reviewed limitations, risks, security and privacy concerns of performing a virtual visit and the availability of in person appointments. I also reviewed that there may be a patient responsible charge related to this service. The patient agreed to proceed.   Patient location: home Provider location: [facility], office Persons participating in this virtual visit: patient, provider   If any vitals were documented, they were collected by patient at home unless specified below.    BP (!) 140/106   Pulse (!) 124   Temp (!) 100.6 F (38.1 C)   Ht 5\' 1"  (1.549 m)   Wt 219 lb (99.3 kg)   LMP 03/02/2023   SpO2 96%   BMI 41.38 kg/m   BP Readings from Last 3 Encounters:  03/09/23 (!) 140/106  11/17/22 128/72  10/11/22 122/82    Pulse Readings from Last 3 Encounters:  03/09/23 (!) 124  11/17/22 84  10/11/22 97   CC: COVID infection Subjective:   HPI: Meredith Campbell is a 37 y.o. female presenting on 03/09/2023 for Covid Positive (C/o body aches, fever- max 100.6, chills, fatigue, HA, cough, chest congestion/burning and SOB. Sxs started 03/08/23. Pos home Covid test this AM. )   First day of symptoms: 03/08/2023 Tested COVID positive: 03/09/2023  Current symptoms: body aches, fever (Tmax 100.6), chills, fatigue, headache, chest congestion and exertional dyspnea with wheezing.  No: abd pain, nausea, diarrhea, leg swelling, loss of taste/smell.  Treatments to date: albuterol inhaler 6 puffs this morning with benefit, tylenol, ibuprofen. Planning to try OTC cough remedy.  Normally takes singulair, dulera 2 puffs BID, albuterol  PRN as well as zyrtec for allergies and asthma.  Risk factors include: asthma, obesity  She requests albuterol neb refill - will look for nebulizer machine.   COVID vaccination status: vaccinated x4     Relevant past medical, surgical, family and social history reviewed and updated as indicated. Interim medical history since our last visit reviewed. Allergies and medications reviewed and updated. Outpatient Medications Prior to Visit  Medication Sig Dispense Refill   albuterol (VENTOLIN HFA) 108 (90 Base) MCG/ACT inhaler Inhale 2 puffs into the lungs every 6 (six) hours as needed for wheezing or shortness of breath. 18 g 5   cetirizine (ZYRTEC) 10 MG tablet Take 10 mg by mouth daily.     fexofenadine (ALLEGRA) 180 MG tablet Take 180 mg by mouth as needed.     hydrOXYzine (VISTARIL) 25 MG capsule Take 1 capsule (25 mg total) by mouth every 8 (eight) hours as needed. 30 capsule 0   mometasone-formoterol (DULERA) 200-5 MCG/ACT AERO INHALE 2 PUFFS INTO THE LUNGS IN THE MORNING AND AT BEDTIME. 1 each 3   montelukast (SINGULAIR) 10 MG tablet Take 1 tablet (10 mg total) by mouth at bedtime. 90 tablet 3   sertraline (ZOLOFT) 100 MG tablet Take two tablets once daily 180 tablet 3   No facility-administered medications prior to visit.     Per HPI unless specifically indicated in ROS section below Review of Systems Objective:  BP (!) 140/106   Pulse (!) 124   Temp (!) 100.6 F (38.1 C)   Ht 5\' 1"  (  1.549 m)   Wt 219 lb (99.3 kg)   LMP 03/02/2023   SpO2 96%   BMI 41.38 kg/m   Wt Readings from Last 3 Encounters:  03/09/23 219 lb (99.3 kg)  11/17/22 222 lb 6.4 oz (100.9 kg)  10/11/22 222 lb 9.6 oz (101 kg)       Physical exam: Gen: alert, NAD, not ill appearing Pulm: speaks in complete sentences without increased work of breathing Psych: normal mood, normal thought content      Results for orders placed or performed in visit on 11/23/21  Nitric oxide  Result Value Ref Range    Nitric Oxide 13    Lab Results  Component Value Date   NA 138 05/25/2020   CL 101 05/25/2020   K 3.8 05/25/2020   CO2 30 05/25/2020   BUN 6 05/25/2020   CREATININE 0.76 05/25/2020   GFR 100.97 05/25/2020   CALCIUM 9.4 05/25/2020   ALBUMIN 4.2 05/25/2020   GLUCOSE 90 05/25/2020   Assessment & Plan:   COVID-19 virus infection Assessment & Plan: Reviewed currently approved antiviral treatments as well as their side effects and risks of use including COVID rebound.  Reviewed expected course of illness, anticipated course of recovery, as well as red flags to suggest COVID pneumonia and/or to seek urgent in-person care. Reviewed CDC isolation/quarantine guidelines.  Encouraged fluids and rest. Reviewed further supportive care measures at home including vit C 500mg  bid, vit D 2000 IU daily, zinc 100mg  daily, tylenol PRN, pepcid 20mg  BID PRN.   Recommend:  Full dose paxlovid Paxlovid drug interactions:  none Discussed [email or web address] discount plan through website if needed.   Tachycardia present even prior to albuterol.  Reviewed in detail red flags to seek ER care including persistent fever, dyspnea, tachycardia or chest pain.  I did ask her to update [place] over weekend with how she's doing.    Other orders -     Albuterol Sulfate; Take 3 mLs (2.5 mg total) by nebulization every 6 (six) hours as needed for wheezing or shortness of breath.  Dispense: 150 mL; Refill: 0 -     nirmatrelvir/ritonavir; Take 3 tablets by mouth 2 (two) times daily for 5 days. (Take nirmatrelvir 150 mg two tablets twice daily for 5 days and ritonavir 100 mg one tablet twice daily for 5 days) Patient GFR is 101  Dispense: 30 tablet; Refill: 0     I discussed the assessment and treatment plan with the patient. The patient was provided an opportunity to ask questions and all were answered. The patient agreed with the plan and demonstrated an understanding of the instructions. The patient was advised to call  back or seek an in-person evaluation if the symptoms worsen or if the condition fails to improve as anticipated.  Follow up plan: No follow-ups on file.  Eustaquio Boyden, MD

## 2023-03-10 ENCOUNTER — Encounter: Payer: Self-pay | Admitting: Family Medicine

## 2023-05-21 ENCOUNTER — Encounter: Payer: Self-pay | Admitting: Family

## 2023-07-18 ENCOUNTER — Encounter: Payer: Self-pay | Admitting: *Deleted

## 2023-07-19 ENCOUNTER — Encounter: Payer: Self-pay | Admitting: Family

## 2023-09-27 ENCOUNTER — Ambulatory Visit (INDEPENDENT_AMBULATORY_CARE_PROVIDER_SITE_OTHER): Payer: Self-pay | Admitting: Family

## 2023-09-27 ENCOUNTER — Encounter: Payer: Self-pay | Admitting: Family

## 2023-09-27 VITALS — BP 132/92 | HR 87 | Temp 97.8°F | Ht 61.0 in | Wt 233.0 lb

## 2023-09-27 DIAGNOSIS — E559 Vitamin D deficiency, unspecified: Secondary | ICD-10-CM | POA: Diagnosis not present

## 2023-09-27 DIAGNOSIS — G4710 Hypersomnia, unspecified: Secondary | ICD-10-CM

## 2023-09-27 DIAGNOSIS — N926 Irregular menstruation, unspecified: Secondary | ICD-10-CM | POA: Diagnosis not present

## 2023-09-27 DIAGNOSIS — R631 Polydipsia: Secondary | ICD-10-CM

## 2023-09-27 DIAGNOSIS — E538 Deficiency of other specified B group vitamins: Secondary | ICD-10-CM | POA: Diagnosis not present

## 2023-09-27 DIAGNOSIS — N921 Excessive and frequent menstruation with irregular cycle: Secondary | ICD-10-CM

## 2023-09-27 DIAGNOSIS — N898 Other specified noninflammatory disorders of vagina: Secondary | ICD-10-CM | POA: Diagnosis not present

## 2023-09-27 DIAGNOSIS — N760 Acute vaginitis: Secondary | ICD-10-CM | POA: Diagnosis not present

## 2023-09-27 DIAGNOSIS — N920 Excessive and frequent menstruation with regular cycle: Secondary | ICD-10-CM | POA: Diagnosis not present

## 2023-09-27 DIAGNOSIS — Z0001 Encounter for general adult medical examination with abnormal findings: Secondary | ICD-10-CM

## 2023-09-27 DIAGNOSIS — N946 Dysmenorrhea, unspecified: Secondary | ICD-10-CM | POA: Diagnosis not present

## 2023-09-27 DIAGNOSIS — Z0000 Encounter for general adult medical examination without abnormal findings: Secondary | ICD-10-CM

## 2023-09-27 DIAGNOSIS — Z79899 Other long term (current) drug therapy: Secondary | ICD-10-CM

## 2023-09-27 DIAGNOSIS — F411 Generalized anxiety disorder: Secondary | ICD-10-CM

## 2023-09-27 DIAGNOSIS — F4323 Adjustment disorder with mixed anxiety and depressed mood: Secondary | ICD-10-CM

## 2023-09-27 LAB — CBC
HCT: 38.4 % (ref 36.0–46.0)
Hemoglobin: 13.1 g/dL (ref 12.0–15.0)
MCHC: 34.3 g/dL (ref 30.0–36.0)
MCV: 87.7 fl (ref 78.0–100.0)
Platelets: 211 10*3/uL (ref 150.0–400.0)
RBC: 4.38 Mil/uL (ref 3.87–5.11)
RDW: 14 % (ref 11.5–15.5)
WBC: 5.1 10*3/uL (ref 4.0–10.5)

## 2023-09-27 LAB — LIPID PANEL
Cholesterol: 161 mg/dL (ref 0–200)
HDL: 68.9 mg/dL (ref >39.00)
LDL Cholesterol: 78 mg/dL (ref 0–99)
NonHDL: 92.22
Total CHOL/HDL Ratio: 2
Triglycerides: 72 mg/dL (ref 0.0–149.0)
VLDL: 14.4 mg/dL (ref 0.0–40.0)

## 2023-09-27 LAB — BASIC METABOLIC PANEL WITH GFR
BUN: 10 mg/dL (ref 6–23)
CO2: 28 meq/L (ref 19–32)
Calcium: 9.1 mg/dL (ref 8.4–10.5)
Chloride: 103 meq/L (ref 96–112)
Creatinine, Ser: 0.72 mg/dL (ref 0.40–1.20)
GFR: 105.24 mL/min (ref >60.00)
Glucose, Bld: 99 mg/dL (ref 70–99)
Potassium: 4 meq/L (ref 3.5–5.1)
Sodium: 137 meq/L (ref 135–145)

## 2023-09-27 MED ORDER — JUNEL FE 1/20 1-20 MG-MCG PO TABS: 1.0000 | ORAL_TABLET | Freq: Every day | ORAL | 11 refills | Status: DC

## 2023-09-27 NOTE — Assessment & Plan Note (Signed)
 Suspect [name] Ordering cbc ibc ferritin

## 2023-09-27 NOTE — Assessment & Plan Note (Signed)
 Pt advised to work on diet and exercise as tolerated

## 2023-09-27 NOTE — Assessment & Plan Note (Signed)
 Due for pap pt to schedule when no longer with bloody discharge.  Patient Counseling(The following topics were reviewed):  Preventative care handout given to pt  Health maintenance and immunizations reviewed. Please refer to Health maintenance section. Pt advised on safe sex, wearing seatbelts in car, and proper nutrition labwork ordered today for annual Dental health: Discussed importance of regular tooth brushing, flossing, and dental visits.  Additional 10 min added on to visit for acute concerns, ordering lab work and performing pelvic exam to evaluate thoroughly

## 2023-09-27 NOTE — Progress Notes (Signed)
 Subjective:  Patient ID: Meredith Campbell, female    DOB: [birth year]  Age: 38 y.o. MRN: [id number]  Patient Care Team: Mort Sawyers, FNP as PCP - General (Family Medicine)   CC:  Chief Complaint  Patient presents with   Annual Exam    HPI Meredith Campbell is a 38 y.o. female who presents today for an annual physical exam. She reports consuming a general diet. The patient does not participate in regular exercise at present. She generally feels well. She reports sleeping fairly well. She does have additional problems to discuss today.   No h/o std,  Has appt with dental and vision coming up.   Last pap: January 24 2019, negative   Pt is with acute concerns.   Concern with menstrual cycles, have become irregular. She isn't sure if her family had perimenopause early but she does know a few aunts have had hysterectomy's at a young age. In jan of 2025 noticed heavy cycle with clots, about two weeks later had another cycle with spotting, brown color and continued spotting until two weeks later where she had a full menses again with heavy period. Stopped and then started again two weeks later and wasn't overly heavy but lasted for about three weeks, and then restarted again two weeks later, last start March 15th and still going. Current period is brown discharge and when she wipes it is red. She has noticed she is hungry and thirsty all of the time. She states when she wipes it feels like 'several micro cuts' and feels like she is wiping with alcohol. No vaginal lesions seen. She does notice when her cycle comes on she gets 'labial cysts' reminds her of cystic acne, is a lump and goes down at the end of her cycle and can be painful. The periods are typical in terms of pain, crampy which is normal for her. She is not shaving her vaginal area. No known h/o herpes, she is a virgin and has never had any sort of sexual intercourse or performed any sexual acts.   She has been on birth control in the  past and didn't continue because   She has had low b12 in the past so she restarted her b12.  She is taking vitamin d as well as this is often low.  She did also start taking iron because of the blood loss.   Anxiety depression, taking sertraline 200 mg once daily.   Advanced Directives Patient does not have advanced directives  DEPRESSION SCREENING    11/17/2022    3:24 PM 10/11/2022   10:33 AM 09/07/2022    9:17 AM 10/21/2021    9:34 AM 08/18/2021   11:09 AM 07/20/2021   10:01 AM 07/01/2020    9:26 AM  PHQ 2/9 Scores  PHQ - 2 Score 2 4 6  0 0 0 2  PHQ- 9 Score 6 12 21 2 4  0 4     ROS: Negative unless specifically indicated above in HPI.    Current Outpatient Medications:    albuterol (PROVENTIL) (2.5 MG/3ML) 0.083% nebulizer solution, Take 3 mLs (2.5 mg total) by nebulization every 6 (six) hours as needed for wheezing or shortness of breath., Disp: 150 mL, Rfl: 0   albuterol (VENTOLIN HFA) 108 (90 Base) MCG/ACT inhaler, Inhale 2 puffs into the lungs every 6 (six) hours as needed for wheezing or shortness of breath., Disp: 18 g, Rfl: 5   budesonide-formoterol (SYMBICORT) 160-4.5 MCG/ACT inhaler, Inhale 2 puffs into the lungs 2 (two)  times daily., Disp: , Rfl:    cetirizine (ZYRTEC) 10 MG tablet, Take 10 mg by mouth daily., Disp: , Rfl:    montelukast (SINGULAIR) 10 MG tablet, Take 1 tablet (10 mg total) by mouth at bedtime., Disp: 90 tablet, Rfl: 3   norethindrone-ethinyl estradiol-FE (JUNEL FE 1/20) 1-20 MG-MCG tablet, Take 1 tablet by mouth daily., Disp: 28 tablet, Rfl: 11   sertraline (ZOLOFT) 100 MG tablet, Take two tablets once daily, Disp: 180 tablet, Rfl: 3    Objective:    BP (!) 132/92 (BP Location: Left Arm, Patient Position: Sitting, Cuff Size: Normal)   Pulse 87   Temp 97.8 F (36.6 C) (Temporal)   Ht 5\' 1"  (1.549 m)   Wt 233 lb (105.7 kg)   LMP 08/15/2023 (Exact Date)   SpO2 99%   BMI 44.02 kg/m     Physical Exam Vitals reviewed.  Constitutional:       General: She is not in acute distress.    Appearance: Normal appearance. She is obese. She is not ill-appearing, toxic-appearing or diaphoretic.  HENT:     Head: Normocephalic.  Cardiovascular:     Rate and Rhythm: Normal rate.  Pulmonary:     Effort: Pulmonary effort is normal.  Genitourinary:    General: Normal vulva.     Pubic Area: No rash.      Labia:        Right: No rash or tenderness.        Left: No rash or tenderness.      Vagina: Vaginal discharge (bloody) and tenderness present.     Comments: Unable to visualize cervix due to tenderness  Musculoskeletal:        General: Normal range of motion.  Neurological:     General: No focal deficit present.     Mental Status: She is alert and oriented to person, place, and time. Mental status is at baseline.  Psychiatric:        Mood and Affect: Mood normal.        Behavior: Behavior normal.        Thought Content: Thought content normal.        Judgment: Judgment normal.          Assessment & Plan:  Excessive thirst  Hypersomnolence Assessment & Plan: Suspect [name] Ordering cbc ibc ferritin  Orders: -     T4, free -     CBC -     Basic metabolic panel with GFR -     IBC + Ferritin  Abnormal menses Assessment & Plan: New onset in last three months  Hormonal labs ordered pending results Start junel ocp  No chance of pregnancy, virgin.  Transvaginal u/s ordered pending results   Orders: -     US PELVIC COMPLETE WITH TRANSVAGINAL; Future -     T4, free -     CBC -     Follicle stimulating hormone -     TSH -     Prolactin -     Testos,Total,Free and SHBG (Female) -     Junel FE 1/20; Take 1 tablet by mouth daily.  Dispense: 28 tablet; Refill: 11 -     IBC + Ferritin  Menorrhagia with irregular cycle -     US PELVIC COMPLETE WITH TRANSVAGINAL; Future  Vitamin D deficiency -     VITAMIN D 25 Hydroxy (Vit-D Deficiency, Fractures)  Vitamin B12 deficiency -     Vitamin B12  Vaginal discharge -  WET PREP BY MOLECULAR PROBE  On statin therapy -     Lipid panel  Adjustment reaction with anxiety and depression  Generalized anxiety disorder Assessment & Plan: Improving Continue sertraline 200 mg nightly    Morbid obesity (HCC) Assessment & Plan: Pt advised to work on diet and exercise as tolerated    Encounter for general adult medical examination with abnormal findings Assessment & Plan: Due for pap pt to schedule when no longer with bloody discharge.  Patient Counseling(The following topics were reviewed):  Preventative care handout given to pt  Health maintenance and immunizations reviewed. Please refer to Health maintenance section. Pt advised on safe sex, wearing seatbelts in car, and proper nutrition labwork ordered today for annual Dental health: Discussed importance of regular tooth brushing, flossing, and dental visits.  Additional 10 min added on to visit for acute concerns, ordering lab work and performing pelvic exam to evaluate thoroughly       Follow-up: Return in about 3 months (around 12/28/2023) for irregular menses.   Mort Sawyers, FNP

## 2023-09-27 NOTE — Assessment & Plan Note (Signed)
 Improving Continue sertraline 200 mg nightly

## 2023-09-27 NOTE — Assessment & Plan Note (Deleted)
 Improving Continue sertraline 200 mg nightly

## 2023-09-27 NOTE — Assessment & Plan Note (Signed)
 New onset in last three months  Hormonal labs ordered pending results Start junel ocp  No chance of pregnancy, virgin.  Transvaginal u/s ordered pending results

## 2023-09-27 NOTE — Patient Instructions (Signed)
  I have sent in your order electronically for the following: transvaginal ultrasound at this location below. Please call to schedule the appointment at your convenience  [facility] outpatient imaging center off [address], [place] [address] Phone [phone]-  8-5 pm

## 2023-09-28 ENCOUNTER — Other Ambulatory Visit: Payer: Self-pay | Admitting: Family

## 2023-09-28 ENCOUNTER — Encounter: Payer: Self-pay | Admitting: *Deleted

## 2023-09-28 ENCOUNTER — Encounter: Payer: Self-pay | Admitting: Family

## 2023-09-28 DIAGNOSIS — E559 Vitamin D deficiency, unspecified: Secondary | ICD-10-CM

## 2023-09-28 DIAGNOSIS — B3731 Acute candidiasis of vulva and vagina: Secondary | ICD-10-CM

## 2023-09-28 LAB — TSH: TSH: 2.58 u[IU]/mL (ref 0.35–5.50)

## 2023-09-28 LAB — IBC + FERRITIN
Ferritin: 28.8 ng/mL (ref 10.0–291.0)
Iron: 73 ug/dL (ref 42–145)
Saturation Ratios: 19.7 % — ABNORMAL LOW (ref 20.0–50.0)
TIBC: 371 ug/dL (ref 250.0–450.0)
Transferrin: 265 mg/dL (ref 212.0–360.0)

## 2023-09-28 LAB — T4, FREE: Free T4: 0.65 ng/dL (ref 0.60–1.60)

## 2023-09-28 LAB — VITAMIN D 25 HYDROXY (VIT D DEFICIENCY, FRACTURES): VITD: 18.49 ng/mL — ABNORMAL LOW (ref 30.00–100.00)

## 2023-09-28 LAB — FOLLICLE STIMULATING HORMONE: FSH: 7.2 m[IU]/mL

## 2023-09-28 LAB — VITAMIN B12: Vitamin B-12: 610 pg/mL (ref 211–911)

## 2023-09-28 MED ORDER — FLUCONAZOLE 150 MG PO TABS: 150.0000 mg | ORAL_TABLET | Freq: Once | ORAL | 0 refills | Status: AC

## 2023-09-28 MED ORDER — CHOLECALCIFEROL 1.25 MG (50000 UT) PO TABS: 1.0000 | ORAL_TABLET | ORAL | 0 refills | Status: DC

## 2023-09-29 ENCOUNTER — Other Ambulatory Visit: Payer: Self-pay | Admitting: Family

## 2023-09-29 DIAGNOSIS — F411 Generalized anxiety disorder: Secondary | ICD-10-CM

## 2023-09-29 DIAGNOSIS — F325 Major depressive disorder, single episode, in full remission: Secondary | ICD-10-CM

## 2023-10-03 ENCOUNTER — Ambulatory Visit
Admission: EM | Admit: 2023-10-03 | Discharge: 2023-10-03 | Disposition: A | Discharge status: Home / Self Care | Attending: Emergency Medicine | Admitting: Emergency Medicine

## 2023-10-03 DIAGNOSIS — J4530 Mild persistent asthma, uncomplicated: Secondary | ICD-10-CM

## 2023-10-03 DIAGNOSIS — J4521 Mild intermittent asthma with (acute) exacerbation: Secondary | ICD-10-CM | POA: Diagnosis not present

## 2023-10-03 LAB — WET PREP BY MOLECULAR PROBE
Candida species: DETECTED — AB
Gardnerella vaginalis: NOT DETECTED
MICRO NUMBER:: 16256014
SPECIMEN QUALITY:: ADEQUATE
Trichomonas vaginosis: NOT DETECTED

## 2023-10-03 LAB — TESTOS,TOTAL,FREE AND SHBG (FEMALE)
Free Testosterone: 1 pg/mL (ref 0.1–6.4)
Sex Hormone Binding: 100.3 nmol/L (ref 17–124)
Testosterone, Total, LC-MS-MS: 12 ng/dL (ref 2–45)

## 2023-10-03 LAB — PROLACTIN: Prolactin: 21.8 ng/mL

## 2023-10-03 MED ORDER — ALBUTEROL SULFATE HFA 108 (90 BASE) MCG/ACT IN AERS: 2.0000 | INHALATION_SPRAY | Freq: Four times a day (QID) | RESPIRATORY_TRACT | 5 refills | Status: AC | PRN

## 2023-10-03 MED ORDER — PREDNISONE 20 MG PO TABS
40.0000 mg | ORAL_TABLET | Freq: Every day | ORAL | 0 refills | Status: DC
Start: 2023-10-03 — End: 2024-01-01

## 2023-10-03 NOTE — Discharge Instructions (Signed)
 You have been evaluated for your breathing which I do believe is related to your asthma  Starting tomorrow take prednisone every morning with food for 5 days  Albuterol inhaler has been refilled    You can take Tylenol  as needed for fever reduction and pain relief.   For cough: honey 1/2 to 1 teaspoon (you can dilute the honey in water or another fluid).  You can also use guaifenesin and dextromethorphan for cough. You can use a humidifier for chest congestion and cough.  If you don't have a humidifier, you can sit in the bathroom with the hot shower running.      For congestion: take a daily anti-histamine like Zyrtec, Claritin, and a oral decongestant, such as pseudoephedrine.  You can also use Flonase 1-2 sprays in each nostril daily.   It is important to stay hydrated: drink plenty of fluids (water, gatorade/powerade/pedialyte, juices, or teas) to keep your throat moisturized and help further relieve irritation/discomfort.

## 2023-10-03 NOTE — ED Triage Notes (Signed)
 Provider assessment happened prior to this RN triage

## 2023-10-03 NOTE — ED Provider Notes (Signed)
 Meredith Campbell    CSN: [id number] Arrival date & time: 10/03/23  1816      History   Chief Complaint No chief complaint on file.   HPI Meredith Campbell is a 38 y.o. female.   Patient presents for evaluation of nasal congestion, rhinorrhea, nonproductive coug, centralized chest tightness, shortness of breath at rest and wheezing present for 7 days.  Associated left-sided ear fullness.  Believes symptoms to be triggered by pollen.  Has had to use rescue inhaler and Symbicort.  Denies fever.  Known sick contacts.  Tolerating food and liquids.   Patient Active Problem List   Diagnosis Date Noted   Abnormal menses 09/27/2023   Hypersomnolence 09/27/2023   Encounter for general adult medical examination with abnormal findings 09/27/2023   Allergic rhinitis due to animal (cat) (dog) hair and dander 10/11/2022   Allergic rhinitis due to pollen 10/11/2022   Chronic allergic conjunctivitis 10/11/2022   Palpitations 07/20/2021   Abnormal finding on EKG 07/20/2021   Vitamin B12 deficiency 05/12/2021   Vitamin D deficiency 05/25/2020   Insomnia 05/25/2020   Morbid obesity (HCC) 05/25/2020   Generalized anxiety disorder 01/24/2019   Mild persistent asthma 03/28/2018    Past Surgical History:  Procedure Laterality Date   EAR CYST EXCISION      OB History   No obstetric history on file.      Home Medications    Prior to Admission medications   Medication Sig Start Date End Date Taking? Authorizing Provider  predniSONE (DELTASONE) 20 MG tablet Take 2 tablets (40 mg total) by mouth daily. 10/03/23  Yes Uilani Sanville R, NP  albuterol (PROVENTIL) (2.5 MG/3ML) 0.083% nebulizer solution Take 3 mLs (2.5 mg total) by nebulization every 6 (six) hours as needed for wheezing or shortness of breath. 03/09/23   Eustaquio Boyden, MD  albuterol (VENTOLIN HFA) 108 (90 Base) MCG/ACT inhaler Inhale 2 puffs into the lungs every 6 (six) hours as needed for wheezing or shortness of breath.  10/03/23   Valinda Hoar, NP  budesonide-formoterol (SYMBICORT) 160-4.5 MCG/ACT inhaler Inhale 2 puffs into the lungs 2 (two) times daily.    [provider]  cetirizine (ZYRTEC) 10 MG tablet Take 10 mg by mouth daily.    [provider]  Cholecalciferol 1.25 MG (50000 UT) TABS Take 1 tablet by mouth once a week. 09/28/23   Mort Sawyers, FNP  montelukast (SINGULAIR) 10 MG tablet Take 1 tablet (10 mg total) by mouth at bedtime. 11/17/22   Mort Sawyers, FNP  norethindrone-ethinyl estradiol-FE (JUNEL FE 1/20) 1-20 MG-MCG tablet Take 1 tablet by mouth daily. 09/27/23   Mort Sawyers, FNP  sertraline (ZOLOFT) 100 MG tablet TAKE 2 TABLETS BY MOUTH EVERY DAY 10/01/23   Mort Sawyers, FNP    Family History Family History  Problem Relation Age of Onset   Diabetes Mother    Hypertension Mother    Hyperlipidemia Mother    Environmental Allergies Sister    Diabetes Maternal Grandmother    Hypertension Maternal Grandmother    Stroke Maternal Grandfather    Stroke Paternal Grandmother    Kidney disease Paternal Uncle     Social History Social History   Tobacco Use   Smoking status: Never   Smokeless tobacco: Never  Vaping Use   Vaping status: Never Used  Substance Use Topics   Alcohol use: Yes    Comment: less than once a year   Drug use: No     Allergies   Patient has no  known allergies.   Review of Systems Review of Systems   Physical Exam Triage Vital Signs ED Triage Vitals [10/03/23 1840]  Encounter Vitals Group     BP 137/87     Systolic BP Percentile      Diastolic BP Percentile      Pulse Rate 100     Resp 16     Temp 99.2 F (37.3 C)     Temp Source Oral     SpO2 98 %     Weight      Height      Head Circumference      Peak Flow      Pain Score      Pain Loc      Pain Education      Exclude from Growth Chart    No data found.  Updated Vital Signs BP 137/87 (BP Location: Left Arm)   Pulse 100   Temp 99.2 F (37.3 C) (Oral)    Resp 16   LMP 08/15/2023 (Exact Date)   SpO2 98%   Visual Acuity Right Eye Distance:   Left Eye Distance:   Bilateral Distance:    Right Eye Near:   Left Eye Near:    Bilateral Near:     Physical Exam Constitutional:      Appearance: Normal appearance.  HENT:     Right Ear: Tympanic membrane, ear canal and external ear normal.     Left Ear: Tympanic membrane, ear canal and external ear normal.     Nose: Congestion present.     Mouth/Throat:     Pharynx: No oropharyngeal exudate or posterior oropharyngeal erythema.  Eyes:     Extraocular Movements: Extraocular movements intact.  Cardiovascular:     Rate and Rhythm: Normal rate and regular rhythm.     Pulses: Normal pulses.     Heart sounds: Normal heart sounds.  Pulmonary:     Effort: Pulmonary effort is normal.     Breath sounds: Normal breath sounds.  Neurological:     Mental Status: She is alert and oriented to person, place, and time. Mental status is at baseline.      UC Treatments / Results  Labs (all labs ordered are listed, but only abnormal results are displayed) Labs Reviewed - No data to display  EKG   Radiology No results found.  Procedures Procedures (including critical care time)  Medications Ordered in UC Medications - No data to display  Initial Impression / Assessment and Plan / UC Course  I have reviewed the triage vital signs and the nursing notes.  Pertinent labs & imaging results that were available during my care of the patient were reviewed by me and considered in my medical decision making (see chart for details).  Mild Intermittent asthma with acute exacerbation  Vital signs are stable, patient in no signs of distress nontoxic-appearing, lungs clear to auscultation and O2 saturation 98% on room air, stable for outpatient management, etiology possibly viral versus weather change, does not affect course of treatment plan at this time, prednisone prescribed, declined IM injection and  refilled inhaler, recommended supportive care with follow-up as needed Final Clinical Impressions(s) / UC Diagnoses   Final diagnoses:  Mild intermittent asthma with (acute) exacerbation     Discharge Instructions      You have been evaluated for your breathing which I do believe is related to your asthma  Starting tomorrow take prednisone every morning with food for 5 days  Albuterol inhaler has  been refilled    You can take Tylenol  as needed for fever reduction and pain relief.   For cough: honey 1/2 to 1 teaspoon (you can dilute the honey in water or another fluid).  You can also use guaifenesin and dextromethorphan for cough. You can use a humidifier for chest congestion and cough.  If you don't have a humidifier, you can sit in the bathroom with the hot shower running.      For congestion: take a daily anti-histamine like Zyrtec, Claritin, and a oral decongestant, such as pseudoephedrine.  You can also use Flonase 1-2 sprays in each nostril daily.   It is important to stay hydrated: drink plenty of fluids (water, gatorade/powerade/pedialyte, juices, or teas) to keep your throat moisturized and help further relieve irritation/discomfort.    ED Prescriptions     Medication Sig Dispense Auth. Provider   albuterol (VENTOLIN HFA) 108 (90 Base) MCG/ACT inhaler Inhale 2 puffs into the lungs every 6 (six) hours as needed for wheezing or shortness of breath. 18 g Elias Bordner R, NP   predniSONE (DELTASONE) 20 MG tablet Take 2 tablets (40 mg total) by mouth daily. 10 tablet Valinda Hoar, NP      PDMP not reviewed this encounter.   Valinda Hoar, NP 10/03/23 1929

## 2023-10-09 ENCOUNTER — Ambulatory Visit
Admission: RE | Admit: 2023-10-09 | Discharge: 2023-10-09 | Disposition: A | Discharge status: Home / Self Care | Source: Ambulatory Visit | Attending: Family | Admitting: Family

## 2023-10-09 DIAGNOSIS — N926 Irregular menstruation, unspecified: Secondary | ICD-10-CM | POA: Insufficient documentation

## 2023-10-09 DIAGNOSIS — N921 Excessive and frequent menstruation with irregular cycle: Secondary | ICD-10-CM | POA: Diagnosis not present

## 2023-10-12 ENCOUNTER — Encounter: Payer: Self-pay | Admitting: Family

## 2023-12-19 ENCOUNTER — Other Ambulatory Visit: Payer: Self-pay | Admitting: Family

## 2023-12-19 DIAGNOSIS — E559 Vitamin D deficiency, unspecified: Secondary | ICD-10-CM

## 2023-12-22 ENCOUNTER — Other Ambulatory Visit: Payer: Self-pay | Admitting: Family

## 2023-12-22 DIAGNOSIS — J4530 Mild persistent asthma, uncomplicated: Secondary | ICD-10-CM

## 2023-12-22 DIAGNOSIS — J309 Allergic rhinitis, unspecified: Secondary | ICD-10-CM

## 2023-12-31 IMAGING — DX DG CHEST 2V
2 series · 2 of 2 positions shown · non-contrast
Comparison: 07/20/2021

CLINICAL DATA: Shortness of breath, wheezing, productive cough and
fatigue.

EXAM:
CHEST - 2 VIEW

[chest pa]
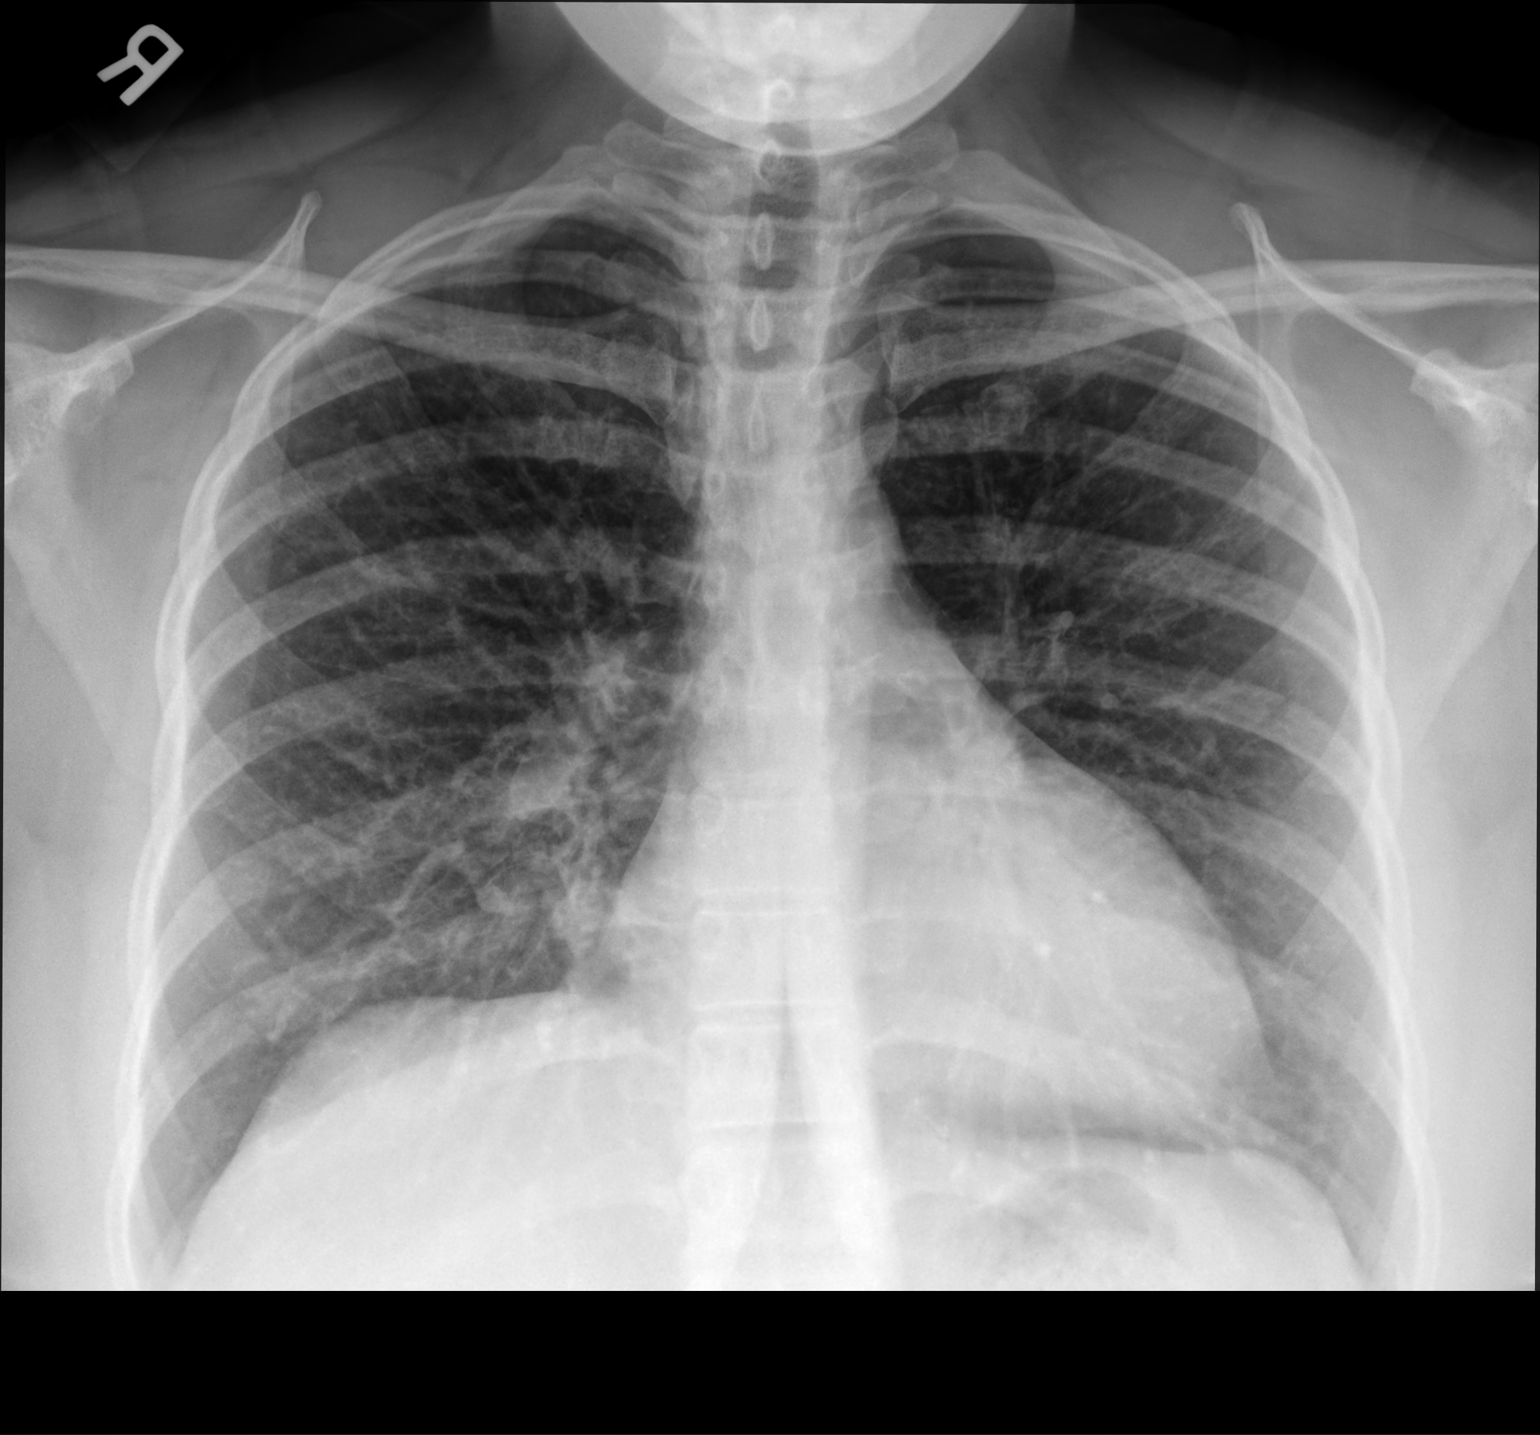

[chest lat]
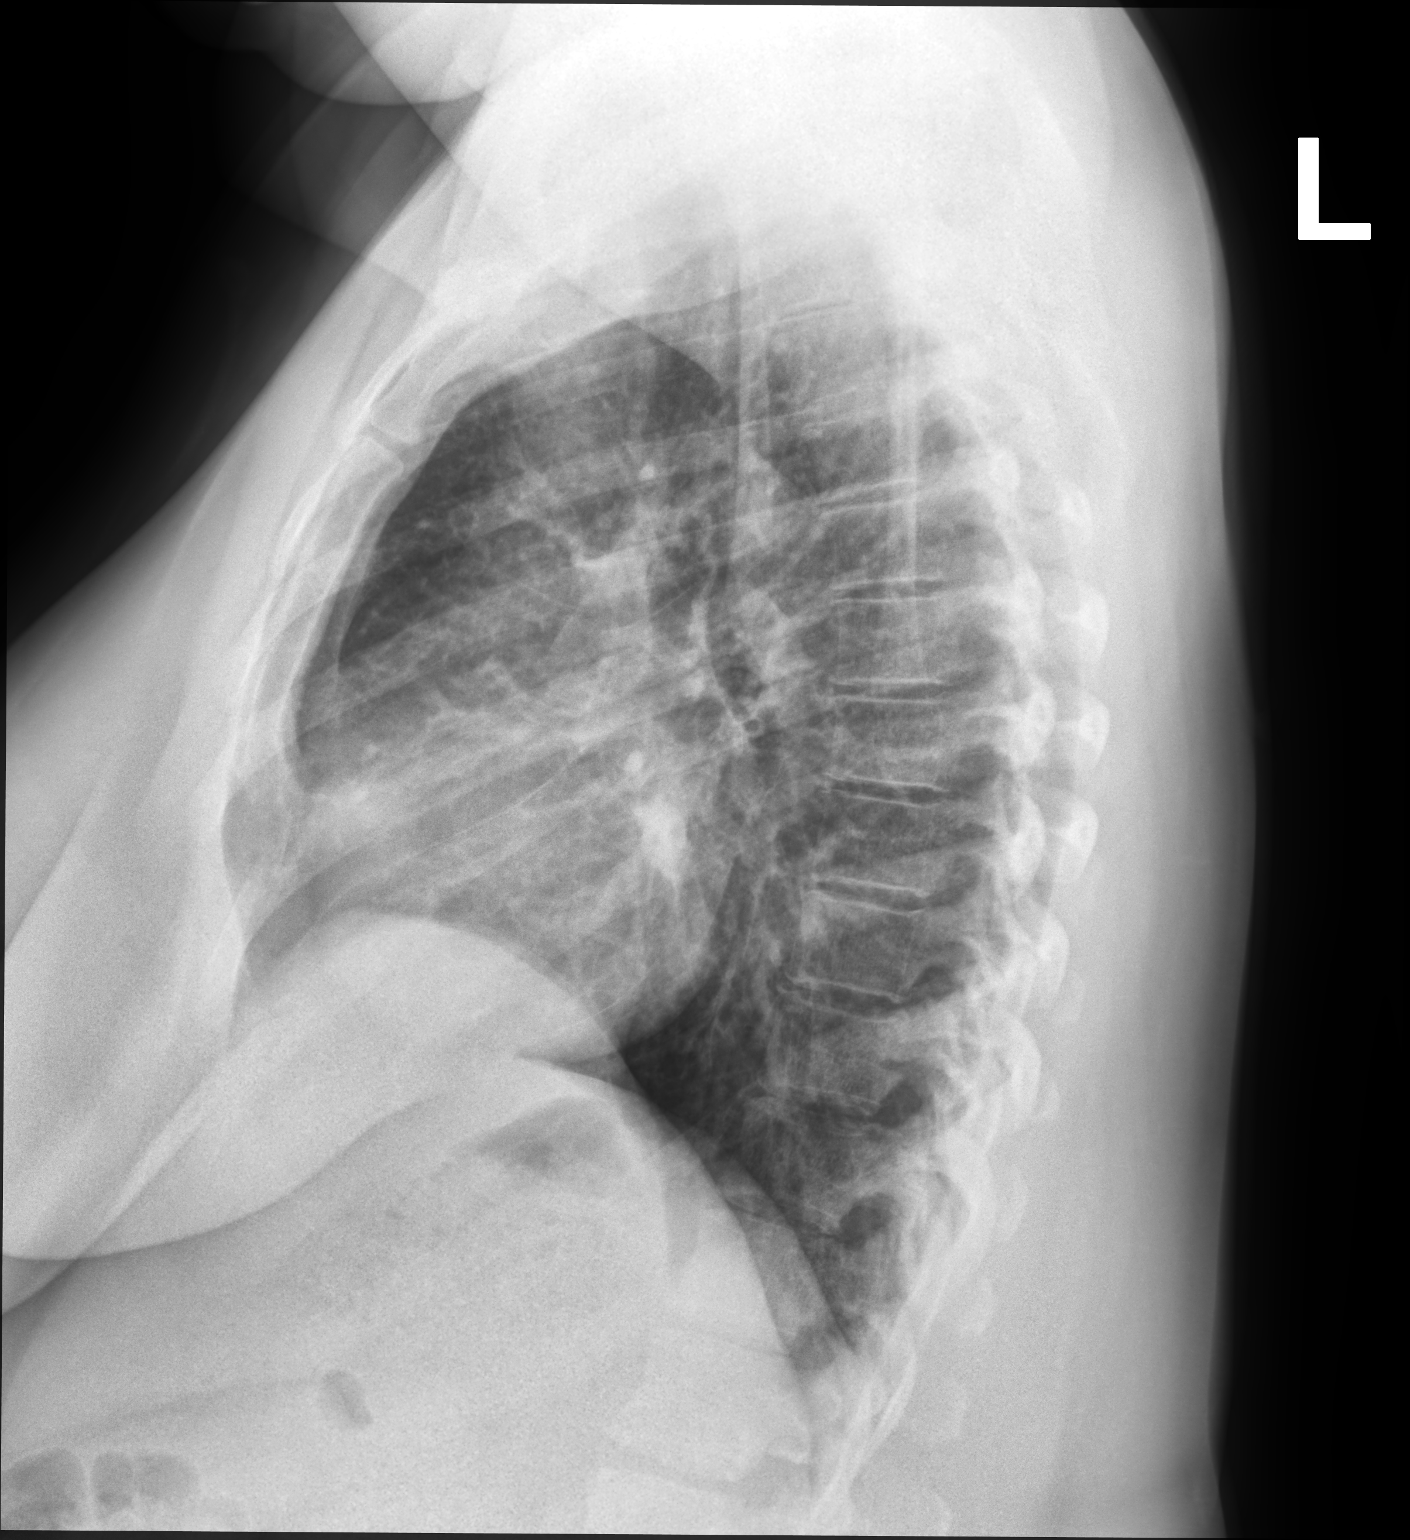

[2 of 2 positions shown; findings below may reference images not displayed]

FINDINGS: The cardiomediastinal contours are normal. Mild bronchial
thickening. Pulmonary vasculature is normal. No consolidation,
pleural effusion, or pneumothorax. No acute osseous abnormalities
are seen.
IMPRESSION: Mild bronchial thickening suggesting asthma or bronchitis.

## 2024-01-01 ENCOUNTER — Ambulatory Visit (INDEPENDENT_AMBULATORY_CARE_PROVIDER_SITE_OTHER): Admitting: Family

## 2024-01-01 VITALS — BP 122/72 | HR 80 | Temp 98.0°F | Resp 16 | Ht 61.0 in | Wt 244.0 lb

## 2024-01-01 DIAGNOSIS — Z1231 Encounter for screening mammogram for malignant neoplasm of breast: Secondary | ICD-10-CM

## 2024-01-01 DIAGNOSIS — N926 Irregular menstruation, unspecified: Secondary | ICD-10-CM

## 2024-01-01 DIAGNOSIS — F411 Generalized anxiety disorder: Secondary | ICD-10-CM | POA: Diagnosis not present

## 2024-01-01 NOTE — Patient Instructions (Addendum)
  I have sent an electronic order over to your preferred location for the following:   []   2D Mammogram  [x]   3D Mammogram  []   Bone Density   Please give this center a call to get scheduled at your convenience.  [x]   [facility]  [address] [place] [address]  [phone]  Make sure to wear two piece  clothing  No lotions powders or deodorants the day of the appointment Make sure to bring picture ID and insurance card.  Bring list of medications you are currently taking including any supplements.   ------------------------------------

## 2024-01-01 NOTE — Assessment & Plan Note (Signed)
 Stable  Cont sertraline  200 mg once daily

## 2024-01-01 NOTE — Assessment & Plan Note (Addendum)
 Pt agreeable to start OCP  Transvaginal ultrasound reviewed and d/w pt  Pt to f/u if no improvement.   Will consider gyn referral if OCP unsuccessful

## 2024-01-01 NOTE — Progress Notes (Signed)
 Established Patient Office Visit  Subjective:      CC:  Chief Complaint  Patient presents with   Medical Management of Chronic Issues    HPI: Meredith Campbell is a 38 y.o. female presenting on 01/01/2024 for Medical Management of Chronic Issues  Menstrual cycles, irregular. Still with heavy painful periods and very irregular. On average she is having periods twice monthly, duration  She does notice during her periods she will have right lower pelvic pain and or left lower pelvic pain depending on ovulation cycle. She does have more lump/bumps on her pelvic region.  She did not try OCP because she is concerned about side effects.  She does have migraines but typically around her cycles. She is a non smoker  Transvaginal u/s 10/2023 without acute findings. 2.4 cm right ovarian follicle, normal finding per radiology.   Anxiety depression: on sertraline  200 mg once daily and states she is doing well. Hsa not been back to therapy but she states she is feeling much better since having seen her.      Social history:  Relevant past medical, surgical, family and social history reviewed and updated as indicated. Interim medical history since our last visit reviewed.  Allergies and medications reviewed and updated.  DATA REVIEWED: CHART IN EPIC     ROS: Negative unless specifically indicated above in HPI.    Current Outpatient Medications:    albuterol  (PROVENTIL ) (2.5 MG/3ML) 0.083% nebulizer solution, Take 3 mLs (2.5 mg total) by nebulization every 6 (six) hours as needed for wheezing or shortness of breath., Disp: 150 mL, Rfl: 0   albuterol  (VENTOLIN  HFA) 108 (90 Base) MCG/ACT inhaler, Inhale 2 puffs into the lungs every 6 (six) hours as needed for wheezing or shortness of breath., Disp: 18 g, Rfl: 5   budesonide -formoterol  (SYMBICORT ) 160-4.5 MCG/ACT inhaler, Inhale 2 puffs into the lungs 2 (two) times daily., Disp: , Rfl:    cetirizine (ZYRTEC) 10 MG tablet, Take 10 mg by  mouth daily., Disp: , Rfl:    Cholecalciferol  1.25 MG (50000 UT) TABS, Take 1 tablet by mouth once a week., Disp: 12 tablet, Rfl: 0   montelukast  (SINGULAIR ) 10 MG tablet, TAKE 1 TABLET BY MOUTH EVERYDAY AT BEDTIME, Disp: 90 tablet, Rfl: 3   sertraline  (ZOLOFT ) 100 MG tablet, TAKE 2 TABLETS BY MOUTH EVERY DAY, Disp: 180 tablet, Rfl: 3      Objective:    BP 122/72   Pulse 80   Temp 98 F (36.7 C)   Resp 16   Ht 5' 1 (1.549 m)   Wt 244 lb (110.7 kg)   SpO2 99%   BMI 46.10 kg/m   Wt Readings from Last 3 Encounters:  01/01/24 244 lb (110.7 kg)  09/27/23 233 lb (105.7 kg)  03/09/23 219 lb (99.3 kg)    Physical Exam Vitals reviewed.  Constitutional:      General: She is not in acute distress.    Appearance: Normal appearance. She is obese. She is not ill-appearing, toxic-appearing or diaphoretic.  HENT:     Head: Normocephalic.   Cardiovascular:     Rate and Rhythm: Normal rate.  Pulmonary:     Effort: Pulmonary effort is normal.   Musculoskeletal:        General: Normal range of motion.   Neurological:     General: No focal deficit present.     Mental Status: She is alert and oriented to person, place, and time. Mental status is at baseline.   Psychiatric:  Mood and Affect: Mood normal.        Behavior: Behavior normal.        Thought Content: Thought content normal.        Judgment: Judgment normal.           Assessment & Plan:  Screening mammogram for breast cancer -     3D Screening Mammogram, Left and Right; Future  Abnormal menses Assessment & Plan: Pt agreeable to start OCP  Transvaginal ultrasound reviewed and d/w pt  Pt to f/u if no improvement.   Will consider gyn referral if OCP unsuccessful     Generalized anxiety disorder Assessment & Plan: Stable  Cont sertraline  200 mg once daily      Return in about 6 months (around 07/03/2024) for f/u CPE.  Ginger Patrick, MSN, APRN, FNP-C [facility]

## 2024-02-12 ENCOUNTER — Encounter

## 2024-03-08 DIAGNOSIS — F430 Acute stress reaction: Secondary | ICD-10-CM | POA: Diagnosis not present

## 2024-03-15 DIAGNOSIS — F430 Acute stress reaction: Secondary | ICD-10-CM | POA: Diagnosis not present

## 2024-03-22 DIAGNOSIS — F430 Acute stress reaction: Secondary | ICD-10-CM | POA: Diagnosis not present

## 2024-03-25 ENCOUNTER — Ambulatory Visit
Admission: RE | Admit: 2024-03-25 | Discharge: 2024-03-25 | Disposition: A | Discharge status: Home / Self Care | Source: Ambulatory Visit | Attending: Family | Admitting: Family

## 2024-03-25 DIAGNOSIS — Z1231 Encounter for screening mammogram for malignant neoplasm of breast: Secondary | ICD-10-CM | POA: Diagnosis not present

## 2024-03-28 ENCOUNTER — Ambulatory Visit: Payer: Self-pay | Admitting: Family

## 2024-03-29 DIAGNOSIS — F430 Acute stress reaction: Secondary | ICD-10-CM | POA: Diagnosis not present

## 2024-04-05 DIAGNOSIS — F430 Acute stress reaction: Secondary | ICD-10-CM | POA: Diagnosis not present

## 2024-04-15 NOTE — Telephone Encounter (Signed)
 Erroneous

## 2024-04-19 DIAGNOSIS — F430 Acute stress reaction: Secondary | ICD-10-CM | POA: Diagnosis not present

## 2024-04-26 DIAGNOSIS — F430 Acute stress reaction: Secondary | ICD-10-CM | POA: Diagnosis not present

## 2024-05-03 DIAGNOSIS — F430 Acute stress reaction: Secondary | ICD-10-CM | POA: Diagnosis not present

## 2024-05-10 DIAGNOSIS — F430 Acute stress reaction: Secondary | ICD-10-CM | POA: Diagnosis not present

## 2024-05-17 DIAGNOSIS — F430 Acute stress reaction: Secondary | ICD-10-CM | POA: Diagnosis not present

## 2024-05-21 DIAGNOSIS — N764 Abscess of vulva: Secondary | ICD-10-CM | POA: Diagnosis not present

## 2024-05-24 DIAGNOSIS — F430 Acute stress reaction: Secondary | ICD-10-CM | POA: Diagnosis not present

## 2024-07-08 ENCOUNTER — Ambulatory Visit (INDEPENDENT_AMBULATORY_CARE_PROVIDER_SITE_OTHER): Admitting: Family

## 2024-07-08 VITALS — BP 124/82 | HR 83 | Temp 98.0°F | Ht 61.0 in | Wt 239.0 lb

## 2024-07-08 DIAGNOSIS — Z1231 Encounter for screening mammogram for malignant neoplasm of breast: Secondary | ICD-10-CM | POA: Diagnosis not present

## 2024-07-08 DIAGNOSIS — Z5181 Encounter for therapeutic drug level monitoring: Secondary | ICD-10-CM

## 2024-07-08 DIAGNOSIS — J45990 Exercise induced bronchospasm: Secondary | ICD-10-CM | POA: Diagnosis not present

## 2024-07-08 DIAGNOSIS — E538 Deficiency of other specified B group vitamins: Secondary | ICD-10-CM

## 2024-07-08 DIAGNOSIS — F411 Generalized anxiety disorder: Secondary | ICD-10-CM | POA: Diagnosis not present

## 2024-07-08 DIAGNOSIS — D500 Iron deficiency anemia secondary to blood loss (chronic): Secondary | ICD-10-CM

## 2024-07-08 DIAGNOSIS — Z23 Encounter for immunization: Secondary | ICD-10-CM | POA: Diagnosis not present

## 2024-07-08 DIAGNOSIS — E559 Vitamin D deficiency, unspecified: Secondary | ICD-10-CM

## 2024-07-08 LAB — IBC + FERRITIN
Ferritin: 34.9 ng/mL (ref 10.0–291.0)
Iron: 92 ug/dL (ref 42–145)
Saturation Ratios: 26.8 % (ref 20.0–50.0)
TIBC: 343 ug/dL (ref 250.0–450.0)
Transferrin: 245 mg/dL (ref 212.0–360.0)

## 2024-07-08 LAB — CBC
HCT: 38 % (ref 36.0–46.0)
Hemoglobin: 13.1 g/dL (ref 12.0–15.0)
MCHC: 34.4 g/dL (ref 30.0–36.0)
MCV: 88 fl (ref 78.0–100.0)
Platelets: 217 K/uL (ref 150.0–400.0)
RBC: 4.31 Mil/uL (ref 3.87–5.11)
RDW: 14.3 % (ref 11.5–15.5)
WBC: 5.4 K/uL (ref 4.0–10.5)

## 2024-07-08 LAB — BASIC METABOLIC PANEL WITH GFR
BUN: 11 mg/dL (ref 6–23)
CO2: 29 meq/L (ref 19–32)
Calcium: 8.9 mg/dL (ref 8.4–10.5)
Chloride: 103 meq/L (ref 96–112)
Creatinine, Ser: 0.74 mg/dL (ref 0.40–1.20)
GFR: 101.28 mL/min
Glucose, Bld: 101 mg/dL — ABNORMAL HIGH (ref 70–99)
Potassium: 3.9 meq/L (ref 3.5–5.1)
Sodium: 137 meq/L (ref 135–145)

## 2024-07-08 LAB — VITAMIN B12: Vitamin B-12: 238 pg/mL (ref 211–911)

## 2024-07-08 LAB — VITAMIN D 25 HYDROXY (VIT D DEFICIENCY, FRACTURES): VITD: 18.42 ng/mL — ABNORMAL LOW (ref 30.00–100.00)

## 2024-07-08 MED ORDER — SERTRALINE HCL 50 MG PO TABS: 50.0000 mg | ORAL_TABLET | Freq: Every day | ORAL | 3 refills | Status: AC

## 2024-07-08 MED ORDER — BUDESONIDE-FORMOTEROL FUMARATE 160-4.5 MCG/ACT IN AERO: 2.0000 | INHALATION_SPRAY | Freq: Two times a day (BID) | RESPIRATORY_TRACT | 3 refills | Status: AC

## 2024-07-08 MED ORDER — SERTRALINE HCL 50 MG PO TABS: 50.0000 mg | ORAL_TABLET | Freq: Every day | ORAL | Status: DC

## 2024-07-08 NOTE — Progress Notes (Signed)
 "  Subjective:  Patient ID: Meredith Campbell, female    DOB: 06/21/1986  Age: 39 y.o. MRN: [id number]  Patient Care Team: Corwin Antu, FNP as PCP - General (Family Medicine)   CC:  Chief Complaint  Patient presents with   Annual Exam    HPI Meredith Campbell is a 39 y.o. female who presents today for an annual physical exam. She reports consuming a general diet. Working on starting a routine, ordered a walking pad and plans to start a few times a week, also bought some adjustable free weights she plans to incorporate She generally feels well. She reports sleeping well. She does not have additional problems to discuss today.   Vision:Within last year Dental:Receives regular dental care STD:The patient denies history of sexually transmitted disease.  Mammogram: 03/25/25 Last pap: January 24 2019 schedule today with [facility]   Pt is without acute concerns.   Discussed the use of AI scribe software for clinical note transcription with the patient, who gave verbal consent to proceed.  History of Present Illness Meredith Campbell is a 39 year old female who presents for an annual physical exam.  She experienced a labial abscess one to two months ago, treated with antibiotics at urgent care, which has since resolved. It tends to occur during her menstrual cycles but has not recurred recently. She has a Pap smear scheduled today with gynecology  She has reduced her sertraline  dosage to 50 mg once daily after participating in a 12-week women's therapy group, which she found beneficial. She feels 'a lot lighter' and has not experienced palpitations recently.  She uses Symbicort  and albuterol  for asthma management. Her chest feels tighter with weather changes, but she uses albuterol  preventatively before exercise and does not require it daily.  She takes over-the-counter vitamin D  and a multivitamin that includes B12, having stopped separate B12 supplements as  it is included in her multivitamin.     Advanced Directives Patient does not have advanced directives   DEPRESSION SCREENING    07/08/2024    9:29 AM 01/01/2024    9:29 AM 11/17/2022    3:24 PM 10/11/2022   10:33 AM 09/07/2022    9:17 AM 10/21/2021    9:34 AM 08/18/2021   11:09 AM  PHQ 2/9 Scores  PHQ - 2 Score 0 0 2 4 6  0 0  PHQ- 9 Score 0 2  6  12  21  2  4       Data saved with a previous flowsheet row definition     ROS: Negative unless specifically indicated above in HPI.   Current Medications[1]    Objective:    BP 124/82 (BP Location: Left Arm, Patient Position: Sitting, Cuff Size: Large)   Pulse 83   Temp 98 F (36.7 C) (Temporal)   Ht 5' 1 (1.549 m)   Wt 239 lb (108.4 kg)   LMP 06/17/2024 (Exact Date)   SpO2 98%   BMI 45.16 kg/m   BP Readings from Last 3 Encounters:  07/08/24 124/82  01/01/24 122/72  10/03/23 137/87      Physical Exam Vitals reviewed.  Constitutional:      General: She is not in acute distress.    Appearance: Normal appearance. She is normal weight. She is not ill-appearing.  HENT:     Head: Normocephalic.     Right Ear: Tympanic membrane normal.     Left Ear: Tympanic membrane normal.     Nose: Nose normal.  Mouth/Throat:     Mouth: Mucous membranes are moist.  Eyes:     Extraocular Movements: Extraocular movements intact.     Pupils: Pupils are equal, round, and reactive to light.  Cardiovascular:     Rate and Rhythm: Normal rate and regular rhythm.  Pulmonary:     Effort: Pulmonary effort is normal.     Breath sounds: Normal breath sounds.  Abdominal:     General: Abdomen is flat. Bowel sounds are normal.     Palpations: Abdomen is soft.     Tenderness: There is no guarding or rebound.  Musculoskeletal:        General: Normal range of motion.     Cervical back: Normal range of motion.  Skin:    General: Skin is warm.     Capillary Refill: Capillary refill takes less than 2 seconds.  Neurological:     General: No  focal deficit present.     Mental Status: She is alert.  Psychiatric:        Mood and Affect: Mood normal.        Behavior: Behavior normal.        Thought Content: Thought content normal.        Judgment: Judgment normal.       Results Labs Vitamin D  (2025): Low Vitamin B12 (2025): Within normal limits      Assessment & Plan:   Assessment and Plan Assessment & Plan Generalized anxiety disorder Well-managed with sertraline  and therapy. She has reduced sertraline  to 50 mg daily and reports feeling lighter and better after participating in a 12-week women's therapy group focusing on body work and educational aspects of anxiety management. - Continue sertraline  50 mg daily. - Continue therapy sessions as beneficial.  Exercise-induced asthma She reports increased chest tightness with weather changes but manages it with albuterol  as needed, especially before exercise. - Continue albuterol  as needed, especially before exercise. - Continue Symbicort  160-4.5 MCG/ACT inhalation 2 times daily.  Vitamin D  deficiency Managed with over-the-counter supplementation. Previous labs showed adequate levels. - Continue over-the-counter vitamin D  supplementation.  Vitamin B12 deficiency Previously managed with supplementation. She has switched to a multivitamin that includes B12 and reports no current supplementation. - Checked vitamin B12 levels to ensure adequacy.  General Health Maintenance She had a recent dental exam and a normal mammogram. She is due for a tetanus vaccine and has received her flu shot. She is considering completing her advanced directive paperwork. - Administered tetanus vaccine. - Encouraged completion of advanced directive paperwork. -Patient Counseling(The following topics were reviewed):  Preventative care handout given to pt  Health maintenance and immunizations reviewed. Please refer to Health maintenance section. Pt advised on safe sex, wearing seatbelts in  car, and proper nutrition labwork ordered today for annual Dental health: Discussed importance of regular tooth brushing, flossing, and dental visits.  Advised pt can not charge for physical as last yearly was done 09/2023 she verbalized understanding          Follow-up: Return in about 1 year (around 07/08/2025) for f/u CPE.   Ginger Patrick, FNP      [1]  Current Outpatient Medications:    albuterol  (PROVENTIL ) (2.5 MG/3ML) 0.083% nebulizer solution, Take 3 mLs (2.5 mg total) by nebulization every 6 (six) hours as needed for wheezing or shortness of breath., Disp: 150 mL, Rfl: 0   albuterol  (VENTOLIN  HFA) 108 (90 Base) MCG/ACT inhaler, Inhale 2 puffs into the lungs every 6 (six) hours as needed for wheezing or  shortness of breath., Disp: 18 g, Rfl: 5   cetirizine (ZYRTEC) 10 MG tablet, Take 10 mg by mouth daily., Disp: , Rfl:    Cholecalciferol  1.25 MG (50000 UT) TABS, Take 1 tablet by mouth once a week., Disp: 12 tablet, Rfl: 0   montelukast  (SINGULAIR ) 10 MG tablet, TAKE 1 TABLET BY MOUTH EVERYDAY AT BEDTIME, Disp: 90 tablet, Rfl: 3   budesonide -formoterol  (SYMBICORT ) 160-4.5 MCG/ACT inhaler, Inhale 2 puffs into the lungs 2 (two) times daily., Disp: 3 each, Rfl: 3   sertraline  (ZOLOFT ) 50 MG tablet, Take 1 tablet (50 mg total) by mouth daily., Disp: 90 tablet, Rfl: 3  "

## 2024-07-09 ENCOUNTER — Ambulatory Visit: Payer: Self-pay | Admitting: Family

## 2024-07-09 DIAGNOSIS — E559 Vitamin D deficiency, unspecified: Secondary | ICD-10-CM

## 2024-07-09 MED ORDER — CHOLECALCIFEROL 1.25 MG (50000 UT) PO TABS: 1.0000 | ORAL_TABLET | ORAL | 0 refills | Status: AC

## 2025-07-09 ENCOUNTER — Encounter: Admitting: Family
